# Patient Record
Sex: Female | Born: 1970
Health system: Southern US, Community
[De-identification: ages and names within clinical notes are randomized; demographics above are authoritative.]

## PROBLEM LIST (undated history)

## (undated) DIAGNOSIS — G8929 Other chronic pain: Secondary | ICD-10-CM

## (undated) DIAGNOSIS — M545 Low back pain, unspecified: Secondary | ICD-10-CM

## (undated) DIAGNOSIS — R7303 Prediabetes: Secondary | ICD-10-CM

## (undated) DIAGNOSIS — E669 Obesity, unspecified: Secondary | ICD-10-CM

## (undated) DIAGNOSIS — E0500 Thyrotoxicosis with diffuse goiter without thyrotoxic crisis or storm: Secondary | ICD-10-CM

## (undated) DIAGNOSIS — N920 Excessive and frequent menstruation with regular cycle: Secondary | ICD-10-CM

## (undated) DIAGNOSIS — C439 Malignant melanoma of skin, unspecified: Secondary | ICD-10-CM

## (undated) DIAGNOSIS — H4010X0 Unspecified open-angle glaucoma, stage unspecified: Secondary | ICD-10-CM

## (undated) DIAGNOSIS — F329 Major depressive disorder, single episode, unspecified: Secondary | ICD-10-CM

## (undated) DIAGNOSIS — H5789 Other specified disorders of eye and adnexa: Secondary | ICD-10-CM

## (undated) DIAGNOSIS — F32A Depression, unspecified: Secondary | ICD-10-CM

## (undated) DIAGNOSIS — I10 Essential (primary) hypertension: Secondary | ICD-10-CM

## (undated) DIAGNOSIS — E079 Disorder of thyroid, unspecified: Secondary | ICD-10-CM

## (undated) DIAGNOSIS — Z9889 Other specified postprocedural states: Secondary | ICD-10-CM

## (undated) DIAGNOSIS — M5431 Sciatica, right side: Secondary | ICD-10-CM

## (undated) DIAGNOSIS — T753XXA Motion sickness, initial encounter: Secondary | ICD-10-CM

## (undated) DIAGNOSIS — M1990 Unspecified osteoarthritis, unspecified site: Secondary | ICD-10-CM

## (undated) DIAGNOSIS — E039 Hypothyroidism, unspecified: Secondary | ICD-10-CM

## (undated) DIAGNOSIS — R112 Nausea with vomiting, unspecified: Secondary | ICD-10-CM

## (undated) HISTORY — DX: Depression, unspecified: F32.A

## (undated) HISTORY — DX: Malignant melanoma of skin, unspecified: C43.9

## (undated) HISTORY — DX: Other specified disorders of eye and adnexa: H57.89

## (undated) HISTORY — DX: Major depressive disorder, single episode, unspecified: F32.9

## (undated) HISTORY — PX: EYE SURGERY: SHX253

## (undated) HISTORY — DX: Obesity, unspecified: E66.9

## (undated) HISTORY — DX: Hypothyroidism, unspecified: E03.9

## (undated) HISTORY — DX: Excessive and frequent menstruation with regular cycle: N92.0

## (undated) HISTORY — PX: OTHER SURGICAL HISTORY: SHX169

## (undated) HISTORY — DX: Thyrotoxicosis with diffuse goiter without thyrotoxic crisis or storm: E05.00

## (undated) HISTORY — DX: Disorder of thyroid, unspecified: E07.9

---

## 2010-01-06 HISTORY — PX: ENDOMETRIAL BIOPSY: SHX622

## 2011-02-07 LAB — HM MAMMOGRAPHY: HM MAMMO: NORMAL

## 2011-03-06 LAB — HM PAP SMEAR: HM PAP: NORMAL

## 2011-12-15 ENCOUNTER — Ambulatory Visit: Payer: Self-pay | Admitting: Internal Medicine

## 2012-01-30 ENCOUNTER — Encounter: Payer: Self-pay | Admitting: Internal Medicine

## 2012-01-30 ENCOUNTER — Ambulatory Visit (INDEPENDENT_AMBULATORY_CARE_PROVIDER_SITE_OTHER): Payer: No Typology Code available for payment source | Admitting: Internal Medicine

## 2012-01-30 VITALS — BP 118/78 | HR 79 | Temp 98.1°F | Resp 16 | Ht 69.75 in | Wt 251.5 lb

## 2012-01-30 DIAGNOSIS — J069 Acute upper respiratory infection, unspecified: Secondary | ICD-10-CM

## 2012-01-30 DIAGNOSIS — E039 Hypothyroidism, unspecified: Secondary | ICD-10-CM

## 2012-01-30 DIAGNOSIS — Z309 Encounter for contraceptive management, unspecified: Secondary | ICD-10-CM

## 2012-01-30 DIAGNOSIS — E669 Obesity, unspecified: Secondary | ICD-10-CM

## 2012-01-30 MED ORDER — LEVOFLOXACIN 500 MG PO TABS: 500.0000 mg | ORAL_TABLET | Freq: Every day | ORAL | Status: DC

## 2012-01-30 NOTE — Progress Notes (Signed)
Patient ID: Lauren Higgins, female   DOB: 1970-11-10, 42 y.o.   MRN: [id number]  Patient Active Problem List  Diagnosis  . Obesity (BMI 30.0-34.9)  . Acute URI  . Hypothyroidism (acquired)    Subjective:  CC:   Chief Complaint  Patient presents with  . Establish Care    HPI:   Lauren Higgins is a 42 y.o. female who presents as a new patient to establish primary care with the chief complaint of Sinus congestion and headache since Monday.  Nasal drainage is clear.  coughing up light green sputum started this morning.  Had contact with a patient with flu but has none of the symptoms.  No fever or myalgias.  Using mucinex twice daily.    History of hyperthyroidism in 1994, treated with RA 131 now hypothyroid managed by [facility], now by Dr, Wendall Mola for hypothyroid.      Sees [facility] for PAPs and mammograms. Feb .  Last PAP Feb 2013.    PGF had CA, not sure which type.  MGM had dementia.  No colon or ovarian Ca.   Left knee pain in early January  ,  Saw orthopedics at [facility]. Received  A steroid injection  For presumed bursitis aggravated by a walking trip in [place]  Obesity: prior trial of phentermine by [facility], lost 50 lbs,  Then gained it all back over a few years.  Dr. Tedd Sias has her on a new drug for long term use.(phentermine/.topriimate combo) has lost 22 lbs thus far.   Contraception:  Using "pull out" method .  Not on folic acid.    Past Medical History  Diagnosis Date  . Thyroid disease     History reviewed. No pertinent past surgical history.  Family History  Problem Relation Age of Onset  . Thyroid disease Father   . Mental illness Maternal Grandmother     dementia  . Cancer Paternal Grandfather   . Stroke Paternal Grandfather     History   Social History  . Marital Status: Married    Spouse Name: N/A    Number of Children: N/A  . Years of Education: N/A   Occupational History  . Not on file.   Social History Main Topics  . Smoking  status: Never Smoker   . Smokeless tobacco: Not on file  . Alcohol Use: No  . Drug Use: No  . Sexually Active:    Other Topics Concern  . Not on file   Social History Narrative  . No narrative on file   No Known Allergies  Review of Systems:   Patient denies headache, fevers, malaise, unintentional weight loss, skin rash, eye pain, sinus congestion and sinus pain, sore throat, dysphagia,  hemoptysis , cough, dyspnea, wheezing, chest pain, palpitations, orthopnea, edema, abdominal pain, nausea, melena, diarrhea, constipation, flank pain, dysuria, hematuria, urinary  Frequency, nocturia, numbness, tingling, seizures,  Focal weakness, Loss of consciousness,  Tremor, insomnia, depression, anxiety, and suicidal ideation.      Objective:  BP 118/78  Pulse 79  Temp 98.1 F (36.7 C) (Oral)  Resp 16  Ht 5' 9.75" (1.772 m)  Wt 251 lb 8 oz (114.08 kg)  BMI 36.35 kg/m2  SpO2 98%  LMP 01/25/2012  General appearance: alert, cooperative and appears stated age Ears: normal TM's and external ear canals both ears Throat: lips, mucosa, and tongue normal; teeth and gums normal Neck: no adenopathy, no carotid bruit, supple, symmetrical, trachea midline and thyroid not enlarged, symmetric,  no tenderness/mass/nodules Back: symmetric, no curvature. ROM normal. No CVA tenderness. Lungs: clear to auscultation bilaterally Heart: regular rate and rhythm, S1, S2 normal, no murmur, click, rub or gallop Abdomen: soft, non-tender; bowel sounds normal; no masses,  no organomegaly Pulses: 2+ and symmetric Skin: Skin color, texture, turgor normal. No rashes or lesions Lymph nodes: Cervical, supraclavicular, and axillary nodes normal.  Assessment and Plan:  Obesity (BMI 30.0-34.9) I have addressed  BMI and recommended a low glycemic index diet utilizing smaller more frequent meals to increase metabolism.  I have also recommended that patient start exercising with a goal of 30 minutes of aerobic exercise  a minimum of 5 days per week. Screening for lipid disorders, thyroid and diabetes to be done today.    Acute URI Currently viral given the mild HEENT  symptoms  I have explained that in viral URIS, an antibiotic will not help the symptoms and will increase the risk of developing diarrhea.,  Continue oral and nasal decongestants,  Ibuprofen 400 mg and tylenol 650 mq 8 hrs for aches and pains,  Delsym OTC for cough,  Antibiotics only if fevers, severe facial or ear pain pr grossly purulent nasal discharge or sputum develop.   Hypothyroidism (acquired) Managed by Dr. Wendall Mola  Contraception management Her current method of contraception is at best, optimistic but not full proof. I recommended that she continues to use only pulling out method as a form of contraception that she consider a folic acid supplement  1 mg daily  In the event that she has an unintended pregnancy .   Updated Medication List Outpatient Encounter Prescriptions as of 01/30/2012  Medication Sig Dispense Refill  . desonide (DESOWEN) 0.05 % lotion Apply 1 application topically daily as needed.       Marland Kitchen levofloxacin (LEVAQUIN) 500 MG tablet Take 1 tablet (500 mg total) by mouth daily.  7 tablet  0  . levothyroxine (SYNTHROID, LEVOTHROID) 125 MCG tablet Take 125 mcg by mouth daily.       Marland Kitchen liothyronine (CYTOMEL) 5 MCG tablet Take 5 mcg by mouth daily.       Marland Kitchen spironolactone-hydrochlorothiazide (ALDACTAZIDE) 25-25 MG per tablet Take 1 tablet by mouth daily.

## 2012-01-30 NOTE — Patient Instructions (Addendum)
Please take 1 mg folic acid daily  --------------------------------------------------------------  You have a viral  Syndrome .  The post nasal drip is causing your cough.  Lavage your sinuses twice daly with Simply saline nasal spray.  Use benadryl 25 mg every 8 hours and Sudafed PE 10 to 30 every 8 hours to manage the drainage and congestion.  Gargle with salt water as needed for the sore throat.  Use Delsym for the cough.  If you develop T > 100.4,  Green/bloody  nasal discharge,  Or facial pain,  Start the antibiotic     You first goal is to lose 20%  Of your current body weight over the next 6 months   This is  my version of a  "Low GI"  Diet:  It is not ultra low carb, but will still lower your blood sugars and allow you to lose 5 to 10 lbs per month if you follow it carefully. All of the foods can be found at grocery stores and in bulk at [employer].  The Atkins protein bars and shakes are available in more varieties at [employer], [employer] and [employer].     7 AM Breakfast:  Low carbohydrate Protein  Shakes (I recommend the EAS AdvantEdge "Carb Control" shakes  Or the low carb shakes by Atkins.   Both are available everywhere:  In  cases at [employer]Js  Or in 4 packs at grocery stores and pharmacies  2.5 carbs  (Alternative is  a toasted Arnold's Sandwhich Thin w/ peanut butter, a "Bagel Thin" with cream cheese and salmon) or  a scrambled egg burrito made with a low carb tortilla .  Avoid cereal and bananas, oatmeal too unless you are cooking the old fashioned kind that takes 30-40 minutes to prepare.  the rest is overly processed, has minimal fiber, and is loaded with carbohydrates!   10 AM: Protein bar by Atkins (the snack size, under 200 cal).  There are many varieties , available widely again or in bulk in limited varieties at [employer]Js)  Other so called "protein bars" tend to be loaded with carbohydrates.  Remember, in food advertising, the word "energy" is synonymous for "  carbohydrate."  Lunch: sandwich of [place], (or any lunchmeat, grilled meat or canned tuna), fresh avocado, mayonnaise  and cheese on a lower carbohydrate pita bread, flatbread, or tortilla . Ok to use regular mayonnaise. The bread is the only source or carbohydrate that can be decreased (Joseph's makes a pita bread and a flat bread that are 50 cal and 4 net carbs ; Toufayan makes a low carb flatbread that's 100 cal and 9 net carbs  and  Mission makes a low carb whole wheat tortilla  That is 210 cal and 6 net carbs)  3 PM:  Mid day :  Another protein bar,  Or a  cheese stick (100 cal, 0 carbs),  Or 1 ounce of  almonds, walnuts, pistachios, pecans, peanuts,  Macadamia nuts. Or a Dannon light n Fit greek yogurt, 80 cal 8 net carbs . Avoid "granola"; the dried cranberries and raisins are loaded with carbohydrates. Mixed nuts ok if no raisins or cranberries or dried fruit.      6 PM  Dinner:  "mean and green:"  Meat/chicken/fish or a high protein legume; , with a green salad, and a low GI  Veggie (broccoli, cauliflower, green beans, spinach, brussel sprouts. Lima beans) : Avoid "Low fat dressings, as well as Reyne Dumas and [address]! They are loaded  with sugar! Instead use ranch, vinagrette,  Blue cheese, etc.  There is a low carb pasta by Dreamfield's available at [facility] that is acceptable and tastes great. Try Michel Angel's chicken piccata over low carb pasta. The chicken dish is 0 carbs, and can be found in frozen section at [employer]Js and [employer]. Also try [employer]" (pulled pork, no sauce,  0 carbs) and his pot roast.   both are in the refrigerated section at [employer]Js   Dreamfield's makes a low carb pasta only 5 g/serving.  Available at all grocery stores,  And tastes like normal pasta  9 PM snack : Breyer's "low carb" fudgsicle or  ice cream bar (Carb Smart line), or  Weight Watcher's ice cream bar , or another "no sugar added" ice cream;a serving of fresh berries/cherries with whipped  cream (Avoid bananas, pineapple, grapes  and watermelon on a regular basis because they are high in sugar)   Remember that snack Substitutions should be less than 10 carbs per serving and meals < 20 carbs. Remember to subtract fiber grams and sugar alcohols to get the "net carbs."

## 2012-02-01 DIAGNOSIS — E039 Hypothyroidism, unspecified: Secondary | ICD-10-CM | POA: Insufficient documentation

## 2012-02-01 DIAGNOSIS — E669 Obesity, unspecified: Secondary | ICD-10-CM | POA: Insufficient documentation

## 2012-02-01 DIAGNOSIS — Z309 Encounter for contraceptive management, unspecified: Secondary | ICD-10-CM

## 2012-02-01 DIAGNOSIS — J069 Acute upper respiratory infection, unspecified: Secondary | ICD-10-CM | POA: Insufficient documentation

## 2012-02-01 HISTORY — DX: Encounter for contraceptive management, unspecified: Z30.9

## 2012-02-01 NOTE — Assessment & Plan Note (Signed)
Managed by Dr. Wendall Mola

## 2012-02-01 NOTE — Assessment & Plan Note (Signed)
Her current method of contraception is at best, optimistic but not full proof. I recommended that she continues to use only pulling out method as a form of contraception that she consider a folic acid supplement  1 mg daily  In the event that she has an unintended pregnancy .

## 2012-02-01 NOTE — Assessment & Plan Note (Signed)
Currently viral given the mild HEENT  symptoms  I have explained that in viral URIS, an antibiotic will not help the symptoms and will increase the risk of developing diarrhea.,  Continue oral and nasal decongestants,  Ibuprofen 400 mg and tylenol 650 mq 8 hrs for aches and pains,  Delsym OTC for cough,  Antibiotics only if fevers, severe facial or ear pain pr grossly purulent nasal discharge or sputum develop.  

## 2012-02-01 NOTE — Assessment & Plan Note (Signed)
I have addressed  BMI and recommended a low glycemic index diet utilizing smaller more frequent meals to increase metabolism.  I have also recommended that patient start exercising with a goal of 30 minutes of aerobic exercise a minimum of 5 days per week. Screening for lipid disorders, thyroid and diabetes to be done today.   

## 2013-02-04 ENCOUNTER — Encounter (INDEPENDENT_AMBULATORY_CARE_PROVIDER_SITE_OTHER): Payer: Self-pay

## 2013-02-04 ENCOUNTER — Ambulatory Visit (INDEPENDENT_AMBULATORY_CARE_PROVIDER_SITE_OTHER): Payer: No Typology Code available for payment source | Admitting: Internal Medicine

## 2013-02-04 ENCOUNTER — Encounter: Payer: Self-pay | Admitting: Internal Medicine

## 2013-02-04 VITALS — BP 130/80 | HR 97 | Temp 98.0°F | Resp 20 | Ht 68.0 in | Wt 250.0 lb

## 2013-02-04 DIAGNOSIS — Z0000 Encounter for general adult medical examination without abnormal findings: Secondary | ICD-10-CM

## 2013-02-04 DIAGNOSIS — M76899 Other specified enthesopathies of unspecified lower limb, excluding foot: Secondary | ICD-10-CM

## 2013-02-04 DIAGNOSIS — M7050 Other bursitis of knee, unspecified knee: Secondary | ICD-10-CM

## 2013-02-04 DIAGNOSIS — E66811 Obesity, class 1: Secondary | ICD-10-CM

## 2013-02-04 DIAGNOSIS — J069 Acute upper respiratory infection, unspecified: Secondary | ICD-10-CM

## 2013-02-04 DIAGNOSIS — E669 Obesity, unspecified: Secondary | ICD-10-CM

## 2013-02-04 NOTE — Progress Notes (Signed)
Pre-visit discussion using our clinic review tool. No additional management support is needed unless otherwise documented below in the visit note.  

## 2013-02-04 NOTE — Assessment & Plan Note (Signed)
Annual comprehensive exam was done excluding breast, pelvic and PAP smear. All screenings have been addressed .

## 2013-02-04 NOTE — Patient Instructions (Signed)
Ok to combine ibuprofen 800 mg three times daily max dose with tylenol 500 mg every 6 hours max daily dose 2000 mg  For OA and bursitis   Talk to hubby about getting sleep study .  I'll be happy to order it but will need to see him first.  If your potassium level is still borderline,  We can try a smaller potassium pill or increase your intake of potassium rich foods

## 2013-02-04 NOTE — Progress Notes (Signed)
Patient ID: Lauren Higgins, female   DOB: 09/22/1970, 43 y.o.   MRN: [id number]    Subjective:     Lauren Higgins is a 43 y.o. female and is here for a comprehensive physical exam.  She had a Pap smear in  2013 by [facility] gynecology. Mammogram is also ordered by gynecology. She sees Dr. Lavone Orn for management of hypothyroidism secondary to history of hyperthyroidism treated with I-131   She was last seen January 2014 and treated last week for sinus congestion and headache.  She has had  several orthopedic evaluations for joint pain involving both knees. Currently symptoms are resolved. She's not exercising however due to prior history of knee pain.    History   Social History  . Marital Status: Married    Spouse Name: N/A    Number of Children: N/A  . Years of Education: N/A   Occupational History  . Not on file.   Social History Main Topics  . Smoking status: Never Smoker   . Smokeless tobacco: Not on file  . Alcohol Use: No  . Drug Use: No  . Sexual Activity: Not on file   Other Topics Concern  . Not on file   Social History Narrative  . No narrative on file   Health Maintenance  Topic Date Due  . Tetanus/tdap  10/15/1990  . Influenza Vaccine  08/06/2013  . Pap Smear  03/05/2014    The following portions of the patient's history were reviewed and updated as appropriate: allergies, current medications, past family history, past medical history, past social history, past surgical history and problem list.  Review of Systems A comprehensive review of systems was negative.   Objective:    BP 130/80  Pulse 97  Temp(Src) 98 F (36.7 C) (Oral)  Resp 20  Ht 5' 8"  (1.727 m)  Wt 250 lb (113.399 kg)  BMI 38.02 kg/m2  SpO2 98%  LMP 01/30/2013   General appearance: alert, cooperative and appears stated age Ears: normal TM's and external ear canals both ears Throat: lips, mucosa, and tongue normal; teeth and gums normal Neck: no adenopathy, no carotid bruit,  supple, symmetrical, trachea midline and thyroid not enlarged, symmetric, no tenderness/mass/nodules Back: symmetric, no curvature. ROM normal. No CVA tenderness. Lungs: clear to auscultation bilaterally Heart: regular rate and rhythm, S1, S2 normal, no murmur, click, rub or gallop Abdomen: soft, non-tender; bowel sounds normal; no masses,  no organomegaly Pulses: 2+ and symmetric Skin: Skin color, texture, turgor normal. No rashes or lesions Lymph nodes: Cervical, supraclavicular, and axillary nodes normal.   Assessment and Plan:   Visit for preventive health examination Annual comprehensive exam was done excluding breast, pelvic and PAP smear. All screenings have been addressed .   Obesity (BMI 30.0-34.9) She has been prescribed phentermine for appetite suppression by Dr. Elisabeth Cara .   Long discussion lasting 20 minutes  regarding herneed for regular aerobic exercise and dietary restriction of carbohydrates. She will return for fasting labs.  Acute URI Symptoms have improved. No further treatment needed.  Bursitis of knee Prior episodes were treated with steroid injections. I discussed use of over-the-counter anti-inflammatories and pain relievers to control symptoms and recommended alternative forms of exercise hat do not aggravate knee joints   Updated Medication List Outpatient Encounter Prescriptions as of 02/04/2013  Medication Sig  . levothyroxine (SYNTHROID, LEVOTHROID) 125 MCG tablet Take 125 mcg by mouth daily.   Marland Kitchen liothyronine (CYTOMEL) 5 MCG tablet Take 5 mcg by mouth daily.   Marland Kitchen  phentermine (ADIPEX-P) 37.5 MG tablet Take 37.5 mg by mouth daily before breakfast.   . potassium chloride SA (K-DUR,KLOR-CON) 20 MEQ tablet Take 20 mEq by mouth daily.   Marland Kitchen spironolactone-hydrochlorothiazide (ALDACTAZIDE) 25-25 MG per tablet Take 1 tablet by mouth daily.   Marland Kitchen tobramycin-dexamethasone (TOBRADEX) ophthalmic solution Place 1 drop into the right eye every 4 (four) hours while awake.   .  [DISCONTINUED] desonide (DESOWEN) 0.05 % lotion Apply 1 application topically daily as needed.   . [DISCONTINUED] levofloxacin (LEVAQUIN) 500 MG tablet Take 1 tablet (500 mg total) by mouth daily.

## 2013-02-06 DIAGNOSIS — M7050 Other bursitis of knee, unspecified knee: Secondary | ICD-10-CM | POA: Insufficient documentation

## 2013-02-06 NOTE — Assessment & Plan Note (Signed)
Symptoms have improved. No further treatment needed.

## 2013-02-06 NOTE — Assessment & Plan Note (Signed)
Prior episodes were treated with steroid injections. I discussed use of over-the-counter anti-inflammatories and pain relievers to control symptoms and recommended alternative forms of exercise hat do not aggravate knee joints

## 2013-02-06 NOTE — Assessment & Plan Note (Addendum)
She has been prescribed phentermine for appetite suppression by Dr. Elisabeth Cara .   Long discussion lasting 20 minutes  regarding herneed for regular aerobic exercise and dietary restriction of carbohydrates. She will return for fasting labs.

## 2013-02-09 ENCOUNTER — Encounter: Payer: Self-pay | Admitting: Internal Medicine

## 2013-02-15 ENCOUNTER — Telehealth: Payer: Self-pay | Admitting: Internal Medicine

## 2013-02-15 DIAGNOSIS — E559 Vitamin D deficiency, unspecified: Secondary | ICD-10-CM | POA: Insufficient documentation

## 2013-02-15 MED ORDER — ERGOCALCIFEROL 1.25 MG (50000 UT) PO CAPS: 50000.0000 [IU] | ORAL_CAPSULE | ORAL | Status: DC

## 2013-02-15 NOTE — Telephone Encounter (Signed)
Please reassign this patient to me , her PCP, so I can look her up more easily.  Thanks!

## 2013-02-16 ENCOUNTER — Other Ambulatory Visit: Payer: Self-pay | Admitting: Internal Medicine

## 2013-02-16 NOTE — Telephone Encounter (Signed)
Done. Thanks.

## 2013-02-23 ENCOUNTER — Telehealth: Payer: Self-pay | Admitting: Internal Medicine

## 2013-03-14 ENCOUNTER — Ambulatory Visit: Payer: Self-pay | Admitting: Ophthalmology

## 2013-04-12 ENCOUNTER — Encounter: Payer: Self-pay | Admitting: Internal Medicine

## 2013-11-23 DIAGNOSIS — I10 Essential (primary) hypertension: Secondary | ICD-10-CM | POA: Insufficient documentation

## 2013-11-23 DIAGNOSIS — E6609 Other obesity due to excess calories: Secondary | ICD-10-CM | POA: Insufficient documentation

## 2013-11-23 DIAGNOSIS — R7303 Prediabetes: Secondary | ICD-10-CM | POA: Insufficient documentation

## 2014-01-24 ENCOUNTER — Ambulatory Visit: Payer: Self-pay

## 2014-02-01 DIAGNOSIS — H532 Diplopia: Secondary | ICD-10-CM | POA: Insufficient documentation

## 2014-02-08 DIAGNOSIS — H50011 Monocular esotropia, right eye: Secondary | ICD-10-CM | POA: Insufficient documentation

## 2014-06-01 DIAGNOSIS — Z9889 Other specified postprocedural states: Secondary | ICD-10-CM | POA: Insufficient documentation

## 2014-12-28 ENCOUNTER — Other Ambulatory Visit: Payer: Self-pay | Admitting: Certified Nurse Midwife

## 2014-12-28 DIAGNOSIS — Z1231 Encounter for screening mammogram for malignant neoplasm of breast: Secondary | ICD-10-CM

## 2015-02-23 ENCOUNTER — Encounter: Payer: Self-pay | Admitting: Emergency Medicine

## 2015-02-23 ENCOUNTER — Emergency Department
Admission: EM | Admit: 2015-02-23 | Discharge: 2015-02-23 | Disposition: A | Discharge status: Home / Self Care | Payer: Managed Care, Other (non HMO) | Attending: Emergency Medicine | Admitting: Emergency Medicine

## 2015-02-23 ENCOUNTER — Emergency Department: Payer: Managed Care, Other (non HMO)

## 2015-02-23 DIAGNOSIS — R519 Headache, unspecified: Secondary | ICD-10-CM

## 2015-02-23 DIAGNOSIS — R51 Headache: Secondary | ICD-10-CM | POA: Insufficient documentation

## 2015-02-23 DIAGNOSIS — Z79899 Other long term (current) drug therapy: Secondary | ICD-10-CM | POA: Insufficient documentation

## 2015-02-23 LAB — CBC WITH DIFFERENTIAL/PLATELET
BASOS ABS: 0.1 10*3/uL (ref 0–0.1)
BASOS PCT: 1 %
Eosinophils Absolute: 0.3 10*3/uL (ref 0–0.7)
Eosinophils Relative: 3 %
HEMATOCRIT: 43.7 % (ref 35.0–47.0)
HEMOGLOBIN: 14.7 g/dL (ref 12.0–16.0)
LYMPHS PCT: 37 %
Lymphs Abs: 3.8 10*3/uL — ABNORMAL HIGH (ref 1.0–3.6)
MCH: 28.5 pg (ref 26.0–34.0)
MCHC: 33.7 g/dL (ref 32.0–36.0)
MCV: 84.6 fL (ref 80.0–100.0)
MONO ABS: 0.6 10*3/uL (ref 0.2–0.9)
Monocytes Relative: 6 %
NEUTROS ABS: 5.4 10*3/uL (ref 1.4–6.5)
NEUTROS PCT: 53 %
Platelets: 227 10*3/uL (ref 150–440)
RBC: 5.16 MIL/uL (ref 3.80–5.20)
RDW: 14.3 % (ref 11.5–14.5)
WBC: 10.2 10*3/uL (ref 3.6–11.0)

## 2015-02-23 LAB — BASIC METABOLIC PANEL
ANION GAP: 10 (ref 5–15)
BUN: 15 mg/dL (ref 6–20)
CHLORIDE: 105 mmol/L (ref 101–111)
CO2: 26 mmol/L (ref 22–32)
Calcium: 9.7 mg/dL (ref 8.9–10.3)
Creatinine, Ser: 0.97 mg/dL (ref 0.44–1.00)
GFR calc non Af Amer: >60 mL/min (ref >60)
GLUCOSE: 89 mg/dL (ref 65–99)
POTASSIUM: 3.7 mmol/L (ref 3.5–5.1)
Sodium: 141 mmol/L (ref 135–145)

## 2015-02-23 MED ORDER — SODIUM CHLORIDE 0.9 % IV BOLUS (SEPSIS)
1000.0000 mL | Freq: Once | INTRAVENOUS | Status: AC
  Administered 2015-02-23: 1000 mL via INTRAVENOUS

## 2015-02-23 MED ORDER — MAGNESIUM SULFATE 2 GM/50ML IV SOLN
2.0000 g | Freq: Once | INTRAVENOUS | Status: AC
  Administered 2015-02-23: 2 g via INTRAVENOUS
  Filled 2015-02-23: qty 50

## 2015-02-23 MED ORDER — PROCHLORPERAZINE MALEATE 10 MG PO TABS: 10.0000 mg | ORAL_TABLET | Freq: Three times a day (TID) | ORAL | Status: DC | PRN

## 2015-02-23 MED ORDER — PROCHLORPERAZINE EDISYLATE 5 MG/ML IJ SOLN
10.0000 mg | Freq: Four times a day (QID) | INTRAMUSCULAR | Status: DC | PRN
  Administered 2015-02-23: 10 mg via INTRAVENOUS
  Filled 2015-02-23: qty 2

## 2015-02-23 NOTE — Discharge Instructions (Signed)
Please seek medical attention for any high fevers, chest pain, shortness of breath, change in behavior, persistent vomiting, bloody stool or any other new or concerning symptoms.   General Headache Without Cause A headache is pain or discomfort felt around the head or neck area. There are many causes and types of headaches. In some cases, the cause may not be found.  HOME CARE  Managing Pain  Take over-the-counter and prescription medicines only as told by your doctor.  Lie down in a dark, quiet room when you have a headache.  If directed, apply ice to the head and neck area:  Put ice in a plastic bag.  Place a towel between your skin and the bag.  Leave the ice on for 20 minutes, 2-3 times per day.  Use a heating pad or hot shower to apply heat to the head and neck area as told by your doctor.  Keep lights dim if bright lights bother you or make your headaches worse. Eating and Drinking  Eat meals on a regular schedule.  Lessen how much alcohol you drink.  Lessen how much caffeine you drink, or stop drinking caffeine. General Instructions  Keep all follow-up visits as told by your doctor. This is important.  Keep a journal to find out if certain things bring on headaches. For example, write down:  What you eat and drink.  How much sleep you get.  Any change to your diet or medicines.  Relax by getting a massage or doing other relaxing activities.  Lessen stress.  Sit up straight. Do not tighten (tense) your muscles.  Do not use tobacco products. This includes cigarettes, chewing tobacco, or e-cigarettes. If you need help quitting, ask your doctor.  Exercise regularly as told by your doctor.  Get enough sleep. This often means 7-9 hours of sleep. GET HELP IF:  Your symptoms are not helped by medicine.  You have a headache that feels different than the other headaches.  You feel sick to your stomach (nauseous) or you throw up (vomit).  You have a  fever. GET HELP RIGHT AWAY IF:   Your headache becomes really bad.  You keep throwing up.  You have a stiff neck.  You have trouble seeing.  You have trouble speaking.  You have pain in the eye or ear.  Your muscles are weak or you lose muscle control.  You lose your balance or have trouble walking.  You feel like you will pass out (faint) or you pass out.  You have confusion.   This information is not intended to replace advice given to you by your health care provider. Make sure you discuss any questions you have with your health care provider.   Document Released: 10/02/2007 Document Revised: 09/13/2014 Document Reviewed: 04/17/2014 Elsevier Interactive Patient Education [employer].

## 2015-02-23 NOTE — ED Provider Notes (Signed)
[facility] Emergency Department Provider Note   ____________________________________________  Time seen: ~1805  I have reviewed the triage vital signs and the nursing notes.   HISTORY  Chief Complaint Headache   History limited by: Not Limited   HPI ANIYIAH ZELL is a 45 y.o. female who presents to the emergency department today because of concerns for headache. She states she has been having a headache for roughly 2 weeks. It had been intermittent but has been constant the past 3 days. She describes it as severe. It is located primarily on the right side of her face and behind her right eye. She denies any change in her vision. She denies any trauma to her head. She has had some associated nausea but no vomiting. She has baseline sensitivity to light since right eye surgeries secondary to Graves' disease. Patient saw her primary care doctor yesterday was given some medication which helped temporarily however the headache came back. Patient denies history of same. Denies any fevers.    Past Medical History  Diagnosis Date  . Thyroid disease     Patient Active Problem List   Diagnosis Date Noted  . Unspecified vitamin D deficiency 02/15/2013  . Bursitis of knee 02/06/2013  . Visit for preventive health examination 02/04/2013  . Obesity (BMI 30.0-34.9) 02/01/2012  . Hypothyroidism (acquired) 02/01/2012  . Contraception management 02/01/2012    Past Surgical History  Procedure Laterality Date  . Eye surgery      Current Outpatient Rx  Name  Route  Sig  Dispense  Refill  . ergocalciferol (VITAMIN D2) 50000 UNITS capsule   Oral   Take 1 capsule (50,000 Units total) by mouth once a week.   12 capsule   0   . levothyroxine (SYNTHROID, LEVOTHROID) 125 MCG tablet   Oral   Take 125 mcg by mouth daily.          Marland Kitchen liothyronine (CYTOMEL) 5 MCG tablet   Oral   Take 5 mcg by mouth daily.          . phentermine (ADIPEX-P) 37.5 MG tablet    Oral   Take 37.5 mg by mouth daily before breakfast.          . spironolactone-hydrochlorothiazide (ALDACTAZIDE) 25-25 MG per tablet   Oral   Take 1 tablet by mouth daily.          Marland Kitchen tobramycin-dexamethasone (TOBRADEX) ophthalmic solution   Right Eye   Place 1 drop into the right eye every 4 (four) hours while awake.            Allergies Review of patient's allergies indicates no known allergies.  Family History  Problem Relation Age of Onset  . Thyroid disease Father   . Mental illness Maternal Grandmother     dementia  . Cancer Paternal Grandfather   . Stroke Paternal Grandfather   . Glaucoma Mother     Social History Social History  Substance Use Topics  . Smoking status: Never Smoker   . Smokeless tobacco: None  . Alcohol Use: No    Review of Systems  Constitutional: Negative for fever. Cardiovascular: Negative for chest pain. Respiratory: Negative for shortness of breath. Gastrointestinal: Negative for abdominal pain, vomiting and diarrhea. Neurological: Positive for headache   10-point ROS otherwise negative.  ____________________________________________   PHYSICAL EXAM:  VITAL SIGNS: ED Triage Vitals  Enc Vitals Group     BP 02/23/15 1557 154/96 mmHg     Pulse Rate 02/23/15 1557 64  Resp 02/23/15 1557 16     Temp 02/23/15 1557 98.3 F (36.8 C)     Temp Source 02/23/15 1557 Oral     SpO2 02/23/15 1557 99 %     Weight 02/23/15 1557 264 lb (119.75 kg)     Height 02/23/15 1557 5' 9"  (1.753 m)     Head Cir --      Peak Flow --      Pain Score 02/23/15 1558 10   Constitutional: Alert and oriented. Appears in mild distress Eyes: Conjunctivae are normal. PERRL. Normal extraocular movements. ENT   Head: Normocephalic and atraumatic.   Nose: No congestion/rhinnorhea.   Mouth/Throat: Mucous membranes are moist.   Neck: No stridor. Hematological/Lymphatic/Immunilogical: No cervical lymphadenopathy. Cardiovascular: Normal rate,  regular rhythm.  No murmurs, rubs, or gallops. Respiratory: Normal respiratory effort without tachypnea nor retractions. Breath sounds are clear and equal bilaterally. No wheezes/rales/rhonchi. Gastrointestinal: Soft and nontender. No distention.  Genitourinary: Deferred Musculoskeletal: Normal range of motion in all extremities. No joint effusions.  No lower extremity tenderness nor edema. Neurologic:  Normal speech and language. No gross focal neurologic deficits are appreciated.  Skin:  Skin is warm, dry and intact. No rash noted. Psychiatric: Mood and affect are normal. Speech and behavior are normal. Patient exhibits appropriate insight and judgment.  ____________________________________________    LABS (pertinent positives/negatives)  Labs Reviewed  CBC WITH DIFFERENTIAL/PLATELET - Abnormal; Notable for the following:    Lymphs Abs 3.8 (*)    All other components within normal limits  BASIC METABOLIC PANEL     ____________________________________________   EKG  None  ____________________________________________    RADIOLOGY  CT head IMPRESSION: No acute intracranial pathology.  Thickened right extraocular muscles compatible with thyroid opthalmopathy.   ____________________________________________   PROCEDURES  Procedure(s) performed: None  Critical Care performed: No  ____________________________________________   INITIAL IMPRESSION / ASSESSMENT AND PLAN / ED COURSE  Pertinent labs & imaging results that were available during my care of the patient were reviewed by me and considered in my medical decision making (see chart for details).  Patient presented to the emergency department today because of concerns for headache. Given the patient does not typically get headaches like this a CT scan was obtained. This did not show any obvious etiology of the headache. The patient states she did feel better after IV fluids and medications. This point I doubt  intracranial bleed given negative CT scan and length of symptoms. Additionally doubt thrombotic clot. Will plan on discharging home with prescription for oral Compazine. Strep patient follow-up with primary care.  ____________________________________________   FINAL CLINICAL IMPRESSION(S) / ED DIAGNOSES  Final diagnoses:  Headache, unspecified headache type     Nance Pear, MD 02/23/15 1927

## 2015-02-23 NOTE — ED Notes (Addendum)
Pt reports intermittent headache x2 weeks; reports constant x3 days. Saw PCP yesterday, given toradol IM and took benadryl, imitrex and phenergan with no relief. Tried medications again today with no relief. Pt reports hx of headaches. Pt reports 3 right eye surgeries in the past year due to graves disease, unknown if related but pain focuses behind right eye and radiates back to ear. Pt reports she is able to turn head a certain way and pain stops.

## 2015-03-01 ENCOUNTER — Other Ambulatory Visit: Payer: Self-pay | Admitting: Ophthalmology

## 2015-03-01 DIAGNOSIS — R519 Headache, unspecified: Secondary | ICD-10-CM

## 2015-03-01 DIAGNOSIS — R51 Headache: Secondary | ICD-10-CM

## 2015-03-01 DIAGNOSIS — E0500 Thyrotoxicosis with diffuse goiter without thyrotoxic crisis or storm: Secondary | ICD-10-CM

## 2015-03-05 ENCOUNTER — Ambulatory Visit
Admission: RE | Admit: 2015-03-05 | Discharge: 2015-03-05 | Disposition: A | Discharge status: Home / Self Care | Payer: Managed Care, Other (non HMO) | Source: Ambulatory Visit | Attending: Ophthalmology | Admitting: Ophthalmology

## 2015-03-05 DIAGNOSIS — E0500 Thyrotoxicosis with diffuse goiter without thyrotoxic crisis or storm: Secondary | ICD-10-CM | POA: Insufficient documentation

## 2015-03-05 DIAGNOSIS — H0520 Unspecified exophthalmos: Secondary | ICD-10-CM | POA: Diagnosis not present

## 2015-03-05 DIAGNOSIS — R51 Headache: Secondary | ICD-10-CM | POA: Insufficient documentation

## 2015-03-05 DIAGNOSIS — R519 Headache, unspecified: Secondary | ICD-10-CM

## 2015-03-19 ENCOUNTER — Ambulatory Visit: Payer: No Typology Code available for payment source

## 2015-03-22 ENCOUNTER — Ambulatory Visit
Admission: RE | Admit: 2015-03-22 | Discharge: 2015-03-22 | Disposition: A | Discharge status: Home / Self Care | Payer: Managed Care, Other (non HMO) | Source: Ambulatory Visit | Attending: Certified Nurse Midwife | Admitting: Certified Nurse Midwife

## 2015-03-22 ENCOUNTER — Other Ambulatory Visit: Payer: Self-pay | Admitting: Certified Nurse Midwife

## 2015-03-22 DIAGNOSIS — Z1231 Encounter for screening mammogram for malignant neoplasm of breast: Secondary | ICD-10-CM

## 2015-03-30 ENCOUNTER — Encounter: Payer: Self-pay | Admitting: Emergency Medicine

## 2015-03-30 ENCOUNTER — Ambulatory Visit
Admission: EM | Admit: 2015-03-30 | Discharge: 2015-03-30 | Disposition: A | Discharge status: Home / Self Care | Payer: Managed Care, Other (non HMO) | Attending: Family Medicine | Admitting: Family Medicine

## 2015-03-30 DIAGNOSIS — J0100 Acute maxillary sinusitis, unspecified: Secondary | ICD-10-CM | POA: Diagnosis not present

## 2015-03-30 HISTORY — DX: Essential (primary) hypertension: I10

## 2015-03-30 MED ORDER — AZITHROMYCIN 250 MG PO TABS
ORAL_TABLET | ORAL | Status: DC
Start: 2015-03-30 — End: 2016-03-28

## 2015-03-30 NOTE — ED Provider Notes (Signed)
CSN: [id number]     Arrival date & time 03/30/15  0920 History   First MD Initiated Contact with Patient 03/30/15 [phone]     Chief Complaint  Patient presents with  . Nasal Congestion   (Consider location/radiation/quality/duration/timing/severity/associated sxs/prior Treatment) HPI   45 year old female who reports that she has had nasal congestion this for about 3 or 4 days which initially improved after using a nasal spray but this morning with worsening Congestion and fatigue. He said no thrill sore throat fever or chills. She denies any coughing. 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 She also has a black eye on the right where she had recently undergone an orbital decompression about 2 weeks ago. Denies any significant nasal discharge has had no bleeding. She does report some shortness of breath with exertion but denies any coughing.  Past Medical History  Diagnosis Date  . Thyroid disease   . Hypertension    Past Surgical History  Procedure Laterality Date  . Eye surgery     Family History  Problem Relation Age of Onset  . Thyroid disease Father   . Mental illness Maternal Grandmother     dementia  . Cancer Paternal Grandfather   . Stroke Paternal Grandfather   . Glaucoma Mother   . Breast cancer Paternal Aunt 54   Social History  Substance Use Topics  . Smoking status: Never Smoker   . Smokeless tobacco: None  . Alcohol Use: No   OB History    No data available     Review of Systems  Constitutional: Negative for fever, chills, activity change and fatigue.  HENT: Positive for congestion, postnasal drip, rhinorrhea, sinus pressure and sneezing.   Respiratory: Positive for shortness of breath. Negative for cough.   All other systems reviewed and are negative.   Allergies  Review of patient's allergies indicates no known allergies.  Home Medications   Prior to  Admission medications   Medication Sig Start Date End Date Taking? Authorizing Provider  HYDROCHLOROTHIAZIDE PO Take 25 mg by mouth.   Yes Historical Provider, MD  azithromycin (ZITHROMAX Z-PAK) 250 MG tablet Use as per package instructions 03/30/15   Lorin Picket, PA-C  ergocalciferol (VITAMIN D2) 50000 UNITS capsule Take 1 capsule (50,000 Units total) by mouth once a week. 02/15/13   Crecencio Mc, MD  levothyroxine (SYNTHROID, LEVOTHROID) 125 MCG tablet Take 137 mcg by mouth daily.  01/02/12   Historical Provider, MD  liothyronine (CYTOMEL) 5 MCG tablet Take 5 mcg by mouth daily.  01/02/12   Historical Provider, MD  phentermine (ADIPEX-P) 37.5 MG tablet Take 37.5 mg by mouth daily before breakfast.  01/07/13   Historical Provider, MD  prochlorperazine (COMPAZINE) 10 MG tablet Take 1 tablet (10 mg total) by mouth every 8 (eight) hours as needed (headache). 02/23/15   Nance Pear, MD  spironolactone-hydrochlorothiazide (ALDACTAZIDE) 25-25 MG per tablet Take 1 tablet by mouth daily.  01/02/12   Historical Provider, MD  tobramycin-dexamethasone [facility]) ophthalmic solution Place 1 drop into the right eye every 4 (four) hours while awake.  01/17/13   Historical Provider, MD   Meds Ordered and Administered this Visit  Medications - No data to display  BP 120/71 mmHg  Pulse 71  Temp(Src) 97.6 F (36.4 C) (Tympanic)  Resp 16  Ht 5' 10"  (1.778 m)  Wt 260 lb (117.935 kg)  BMI 37.31 kg/m2  SpO2  99%  LMP 03/12/2015 No data found.   Physical Exam  Constitutional: She is oriented to person, place, and time. She appears well-developed and well-nourished. No distress.  HENT:  Head: Normocephalic and atraumatic.  Right Ear: External ear normal.  Left Ear: External ear normal.  Nose: Nose normal.  Mouth/Throat: Oropharynx is clear and moist.  Right ear has a bandage from mole excision which she had earlier this morning  Eyes: Pupils are equal, round, and reactive to light. Right eye  exhibits no discharge. Left eye exhibits no discharge.  Neck: Normal range of motion. Neck supple.  Pulmonary/Chest: Effort normal and breath sounds normal. No respiratory distress. She has no wheezes. She has no rales.  Musculoskeletal: Normal range of motion. She exhibits no edema or tenderness.  Lymphadenopathy:    She has no cervical adenopathy.  Neurological: She is alert and oriented to person, place, and time.  Skin: Skin is warm and dry. She is not diaphoretic.  Psychiatric: She has a normal mood and affect. Her behavior is normal. Judgment and thought content normal.  Nursing note and vitals reviewed.   ED Course  Procedures (including critical care time)  Labs Review Labs Reviewed - No data to display  Imaging Review No results found.   Visual Acuity Review  Right Eye Distance:   Left Eye Distance:   Bilateral Distance:    Right Eye Near:   Left Eye Near:    Bilateral Near:         MDM   1. Acute maxillary sinusitis, recurrence not specified    Discharge Medication List as of 03/30/2015 10:29 AM    START taking these medications   Details  azithromycin (ZITHROMAX Z-PAK) 250 MG tablet Use as per package instructions, Normal      Plan: 1. Test/x-ray results and diagnosis reviewed with patient 2. rx as per orders; risks, benefits, potential side effects reviewed with patient 3. Recommend supportive treatment with Rest and fluids as necessary. I have told her I don't know if the surgery that she had on her eye has any bearing on her symptoms today but I am going to err on the side of conservative and give her a prescription for Z-Pak. If she does not improve or worsens she should contact the surgeon immediately. I've also given her today off from work and to allow her to rest today and tomorrow since she is leaving on a business trip on Sunday driving to [place]. Hopefully this will clear quickly for her. She'll continue using her Flonase and have  recommended her considering Zyrtec-D or Allegra-D for her congestion. If she has any problems or worsening she should contact her primary care physician or the surgeon. 4. F/u prn if symptoms worsen or don't improve     Lorin Picket, PA-C 03/30/15 1041

## 2015-03-30 NOTE — Discharge Instructions (Signed)

## 2015-03-30 NOTE — ED Notes (Signed)
Pt reports nasal congestion that started about 3-4 days ago improved after using nasal spray but then awoke this morning with worsening congestion and fatigue. Pt denies sore throat, fever, or cough

## 2015-10-08 NOTE — Progress Notes (Signed)
[name] [address] [place], [place] [address] Phone: [phone] Subjective:    I'm seeing this patient by the request  of:  Dion Body, MD   CC: Left shoulder pain  [id number]  Lauren Higgins is a 45 y.o. female coming in with complaint of left shoulder pain. Has had a history of rotator cuff tendinitis as well as a subacromial bursitis of the left shoulder. Has been in rehabilitation for this previously. Patient was seen 6 weeks ago by another provider and was given an injection of left shoulder. Patient states Continuing him pain. Seems to be more on the left shoulder. States that certain activities such as even doing her bra has seemed to be more difficult. States that sleeping on it as a severe pain. Rates the severity of 7 out of 10. Mild radiation down the arm but denies any neck pain. No weakness. No numbness. Has not responded to oral anti-inflammatories or any heat or ice.     Past Medical History:  Diagnosis Date  . Hypertension   . Thyroid disease    Past Surgical History:  Procedure Laterality Date  . EYE SURGERY     Social History   Social History  . Marital status: Married    Spouse name: N/A  . Number of children: N/A  . Years of education: N/A   Social History Main Topics  . Smoking status: Never Smoker  . Smokeless tobacco: None  . Alcohol use No  . Drug use: No  . Sexual activity: Not Asked   Other Topics Concern  . None   Social History Narrative  . None   No Known Allergies Family History  Problem Relation Age of Onset  . Thyroid disease Father   . Mental illness Maternal Grandmother     dementia  . Cancer Paternal Grandfather   . Stroke Paternal Grandfather   . Glaucoma Mother   . Breast cancer Paternal Aunt 47    Past medical history, social, surgical and family history all reviewed in electronic medical record.  No pertanent information unless stated regarding to the chief complaint.    Review of Systems: No headache, visual changes, nausea, vomiting, diarrhea, constipation, dizziness, abdominal pain, skin rash, fevers, chills, night sweats, weight loss, swollen lymph nodes, body aches, joint swelling, muscle aches, chest pain, shortness of breath, mood changes.   Objective  Blood pressure 118/80, pulse 65, weight 258 lb (117 kg), SpO2 97 %.  General: No apparent distress alert and oriented x3 mood and affect normal, dressed appropriately.  HEENT: Pupils equal, extraocular movements intact  Respiratory: Patient's speak in full sentences and does not appear short of breath  Cardiovascular: No lower extremity edema, non tender, no erythema  Skin: Warm dry intact with no signs of infection or rash on extremities or on axial skeleton.  Abdomen: Soft nontender  Neuro: Cranial nerves II through XII are intact, neurovascularly intact in all extremities with 2+ DTRs and 2+ pulses.  Lymph: No lymphadenopathy of posterior or anterior cervical chain or axillae bilaterally.  Gait normal with good balance and coordination.  MSK:  Non tender with full range of motion and good stability and symmetric strength and tone of  elbows, wrist, hip, knee and ankles bilaterally.  Shoulder: left Inspection reveals no abnormalities, atrophy or asymmetry. Palpation is normal with no tenderness over AC joint or bicipital groove.  Limited range of motion lacking the last 20 of forward flexion, external rotation of 5, and  internal rotation to sacrum Rotator cuff strength normal throughout. signs of impingement with positive Neer and Hawkin's tests, but negative empty can sign. Speeds and Yergason's tests normal. No labral pathology noted with negative Obrien's, negative clunk and good stability. Normal scapular function observed. No painful arc and no drop arm sign. No apprehension sign Contralateral shoulder unremarkable  MSK US performed of: left This study was ordered, performed, and  interpreted by Charlann Boxer D.O.  Shoulder:   Supraspinatus:  Appears normal on long and transverse views, significant thickening of the anterior capsule noted Infraspinatus:  Appears normal on long and transverse views. Significant increase in Doppler flow Subscapularis:  Appears normal on long and transverse views. Thickening of the anterior capsule noted Teres Minor:  Appears normal on long and transverse views. AC joint:  Moderate capsule distention Glenohumeral Joint:  Appears normal without effusion. Glenoid Labrum:  Intact without visualized tears. Patient does have severe thickening on the posterior capsule Biceps Tendon:  Appears normal on long and transverse views, no fraying of tendon, tendon located in intertubercular groove, no subluxation with shoulder internal or external rotation.  Impression: Frozen shoulder  Procedure: Real-time Ultrasound Guided Injection of left glenohumeral joint Device: GE Logiq E  Ultrasound guided injection is preferred based studies that show increased duration, increased effect, greater accuracy, decreased procedural pain, increased response rate with ultrasound guided versus blind injection.  Verbal informed consent obtained.  Time-out conducted.  Noted no overlying erythema, induration, or other signs of local infection.  Skin prepped in a sterile fashion.  Local anesthesia: Topical Ethyl chloride.  With sterile technique and under real time ultrasound guidance:  Joint visualized.  23g 1  inch needle inserted posterior approach. Pictures taken for needle placement. Patient did have injection of 2 cc of 1% lidocaine, 2 cc of 0.5% Marcaine, and 1.0 cc of Kenalog 40 mg/dL. Completed without difficulty  Pain immediately resolved suggesting accurate placement of the medication.  Advised to call if fevers/chills, erythema, induration, drainage, or persistent bleeding.  Images permanently stored and available for review in the ultrasound unit.    Impression: Technically successful ultrasound guided injection.  Procedure note 23300; 15 minutes spent for Therapeutic exercises as stated in above notes.  This included exercises focusing on stretching, strengthening, with significant focus on eccentric aspects.  Shoulder Exercises that included:  Basic scapular stabilization to include adduction and depression of scapula Scaption, focusing on proper movement and good control Internal and External rotation utilizing a theraband, with elbow tucked at side entire time Rows with theraband   Proper technique shown and discussed handout in great detail with ATC.  All questions were discussed and answered.     Impression and Recommendations:     This case required medical decision making of moderate complexity.      Note: This dictation was prepared with Dragon dictation along with smaller phrase technology. Any transcriptional errors that result from this process are unintentional.

## 2015-10-09 ENCOUNTER — Other Ambulatory Visit: Payer: Self-pay

## 2015-10-09 ENCOUNTER — Ambulatory Visit (INDEPENDENT_AMBULATORY_CARE_PROVIDER_SITE_OTHER): Payer: Managed Care, Other (non HMO) | Admitting: Family Medicine

## 2015-10-09 ENCOUNTER — Encounter: Payer: Self-pay | Admitting: Family Medicine

## 2015-10-09 VITALS — BP 118/80 | HR 65 | Wt 258.0 lb

## 2015-10-09 DIAGNOSIS — G8929 Other chronic pain: Secondary | ICD-10-CM | POA: Diagnosis not present

## 2015-10-09 DIAGNOSIS — M7502 Adhesive capsulitis of left shoulder: Secondary | ICD-10-CM | POA: Diagnosis not present

## 2015-10-09 DIAGNOSIS — M25512 Pain in left shoulder: Secondary | ICD-10-CM

## 2015-10-09 DIAGNOSIS — M7500 Adhesive capsulitis of unspecified shoulder: Secondary | ICD-10-CM | POA: Insufficient documentation

## 2015-10-09 MED ORDER — DICLOFENAC SODIUM 2 % TD SOLN: 2.0000 "application " | Freq: Two times a day (BID) | TRANSDERMAL | 3 refills | Status: DC

## 2015-10-09 NOTE — Patient Instructions (Signed)
Good to see you.  Ice 20 minutes 2 times daily. Usually after activity and before bed. Exercises 3 times a week.  pennsaid pinkie amount topically 2 times daily as needed.  Vitamin D 2000 IU dialy  Keep moving the arm.  Ok to do the meloxicam  See me again in 4-6 weeks to make sure you are well.

## 2015-10-09 NOTE — Assessment & Plan Note (Signed)
Patient was given injection today. Work with Product/process development scientist July the exercises. Given topical anti-inflammatories that ankle be beneficial. Discussed icing regimen. Patient will continue with range of motion. Follow-up and see me again in 4-6 weeks. Forcing pain consider formal physical therapy.

## 2015-10-25 ENCOUNTER — Other Ambulatory Visit: Payer: Managed Care, Other (non HMO)

## 2015-10-25 DIAGNOSIS — G8929 Other chronic pain: Secondary | ICD-10-CM

## 2015-10-25 DIAGNOSIS — M25512 Pain in left shoulder: Principal | ICD-10-CM

## 2015-11-10 NOTE — Progress Notes (Signed)
[facility] [place], [place] [address] Phone: [phone] Subjective:    I'm seeing this patient by the request  of:  Dion Body, MD   CC: Left shoulder pain Follow-up  [id number]  Lauren Higgins is a 45 y.o. female coming in with complaint of left shoulder pain. Has had a history of rotator cuff tendinitis as well as a subacromial bursitis of the left shoulde patient was initially by anotherf provider and given an injection 14 weeks ago. Was seen by me 6 weeks ago and diagnosed with frozen shoulder and and was given an injection. Patient has done all conservative therapy including formal physical therapy. Patient was to continue conservative therapy such as home exercises icing protocol and topical anti-inflammatories. Patient states Pain is improved overall. Starting have increasing pain again at night. Denies any radiation of the shoulder any new weakness. States that it just continues to give her some difficulty. Now is having audible popping sometimes it does seem to relieve some of the pain though.    Past Medical History:  Diagnosis Date  . Hypertension   . Thyroid disease    Past Surgical History:  Procedure Laterality Date  . EYE SURGERY     Social History   Social History  . Marital status: Married    Spouse name: N/A  . Number of children: N/A  . Years of education: N/A   Social History Main Topics  . Smoking status: Never Smoker  . Smokeless tobacco: None  . Alcohol use No  . Drug use: No  . Sexual activity: Not Asked   Other Topics Concern  . None   Social History Narrative  . None   No Known Allergies Family History  Problem Relation Age of Onset  . Thyroid disease Father   . Mental illness Maternal Grandmother     dementia  . Cancer Paternal Grandfather   . Stroke Paternal Grandfather   . Glaucoma Mother   . Breast cancer Paternal Aunt 67    Past medical history, social, surgical and family  history all reviewed in electronic medical record.  No pertanent information unless stated regarding to the chief complaint.   Review of Systems: No headache, visual changes, nausea, vomiting, diarrhea, constipation, dizziness, abdominal pain, skin rash, fevers, chills, night sweats, weight loss, swollen lymph nodes, body aches, joint swelling, muscle aches, chest pain, shortness of breath, mood changes.   Objective  Blood pressure 114/80, pulse 69, height 5' 8"  (1.727 m), weight 263 lb (119.3 kg), SpO2 96 %.  Systems examined below as of 11/12/15 General: NAD A&O x3 mood, affect normal  HEENT: Pupils equal, extraocular movements intact no nystagmus Respiratory: not short of breath at rest or with speaking Cardiovascular: No lower extremity edema, non tender Skin: Warm dry intact with no signs of infection or rash on extremities or on axial skeleton. Abdomen: Soft nontender, no masses Neuro: Cranial nerves  intact, neurovascularly intact in all extremities with 2+ DTRs and 2+ pulses. Lymph: No lymphadenopathy appreciated today  Gait normal with good balance and coordination.  MSK: Non tender with full range of motion and good stability and symmetric strength and tone of  elbows, wrist,  knee hips and ankles bilaterally.  Shoulder: left Inspection reveals no abnormalities, atrophy or asymmetry. Palpation is normal with no tenderness over AC joint or bicipital groove.  Limited range of motion lacking the last 15 of forward flexion, external rotation of 5, and internal rotation to L5  Rotator cuff strength normal throughout. Mary mild impingement sign still remaining Speeds and Yergason's tests normal. No labral pathology noted with negative Obrien's, negative clunk and good stability. Normal scapular function observed. No painful arc and no drop arm sign. No apprehension sign Contralateral shoulder unremarkable Mild improvement from previous exam       Impression and  Recommendations:     This case required medical decision making of moderate complexity.      Note: This dictation was prepared with Dragon dictation along with smaller phrase technology. Any transcriptional errors that result from this process are unintentional.

## 2015-11-12 ENCOUNTER — Ambulatory Visit (INDEPENDENT_AMBULATORY_CARE_PROVIDER_SITE_OTHER): Payer: Managed Care, Other (non HMO) | Admitting: Family Medicine

## 2015-11-12 ENCOUNTER — Encounter: Payer: Self-pay | Admitting: Family Medicine

## 2015-11-12 DIAGNOSIS — E669 Obesity, unspecified: Secondary | ICD-10-CM | POA: Diagnosis not present

## 2015-11-12 DIAGNOSIS — M7502 Adhesive capsulitis of left shoulder: Secondary | ICD-10-CM

## 2015-11-12 DIAGNOSIS — E559 Vitamin D deficiency, unspecified: Secondary | ICD-10-CM

## 2015-11-12 MED ORDER — GABAPENTIN 100 MG PO CAPS: 200.0000 mg | ORAL_CAPSULE | Freq: Every day | ORAL | 3 refills | Status: DC

## 2015-11-12 NOTE — Assessment & Plan Note (Addendum)
Discussed with patient again. We'll discuss with her at great length. We discussed home exercises, icing protocol, which activities to do a which was to avoid. Patient is going to start on gabapentin for the nighttime pain. We discussed which activities to potentially avoid. Patient will continue all the other medications. Patient come back in 3 weeks. If worsening symptoms we'll consider repeat injection. Discussed with patient also has any worsening symptoms advance imaging would be warranted to rule out any labral pathology that can be within the differential.

## 2015-11-12 NOTE — Assessment & Plan Note (Signed)
Incurred supplementation.

## 2015-11-12 NOTE — Assessment & Plan Note (Signed)
Encourage weight loss. Patient will continue to monitor.

## 2015-11-12 NOTE — Patient Instructions (Signed)
Good to see you  Lauren Higgins is your friend  Keep working on the range of motion as much as you can.  Keep hands within peripheral vision with lifting.  Gabapentin 21m at night See me again in 3-4 weeks and if needed we will inject again.

## 2015-12-09 NOTE — Progress Notes (Signed)
[name] [address] [place], [place] [address] Phone: [phone] Subjective:    I'm seeing this patient by the request  of:  Dion Body, MD   CC: Left shoulder pain Follow-up  [id number]  Lauren Higgins is a 45 y.o. female coming in with complaint of left shoulder pain. Has had a history of rotator cuff tendinitis as well as a subacromial bursitis of the left shoulder.  Patient did see me 2 months ago and was given an injection of the shoulder. One month ago patient was doing much better. Patient was started on gabapentin low for any type of cervical pathology that can be contributing and to help with nighttime pain. Patient states was doing well but has not been doing the exercises quite as regularly. Has been fairly noncompliant with the medications. States that it has not notice any significant improvement in range of motion and is having worsening pain again. Starting wake her up at night.    Past Medical History:  Diagnosis Date  . Hypertension   . Thyroid disease    Past Surgical History:  Procedure Laterality Date  . EYE SURGERY     Social History   Social History  . Marital status: Married    Spouse name: N/A  . Number of children: N/A  . Years of education: N/A   Social History Main Topics  . Smoking status: Never Smoker  . Smokeless tobacco: None  . Alcohol use No  . Drug use: No  . Sexual activity: Not Asked   Other Topics Concern  . None   Social History Narrative  . None   No Known Allergies Family History  Problem Relation Age of Onset  . Thyroid disease Father   . Mental illness Maternal Grandmother     dementia  . Cancer Paternal Grandfather   . Stroke Paternal Grandfather   . Glaucoma Mother   . Breast cancer Paternal Aunt 60    Past medical history, social, surgical and family history all reviewed in electronic medical record.  No pertanent information unless stated regarding to the chief  complaint.   Review of Systems: No headache, visual changes, nausea, vomiting, diarrhea, constipation, dizziness, abdominal pain, skin rash, fevers, chills, night sweats, weight loss, swollen lymph nodes, body aches, joint swelling, muscle aches, chest pain, shortness of breath, mood changes.   Objective  Blood pressure 112/84, pulse 66, height 5' 9"  (1.753 m), weight 265 lb 3.2 oz (120.3 kg).  Systems examined below as of 12/10/15 General: NAD A&O x3 mood, affect normal  HEENT: Pupils equal, extraocular movements intact no nystagmus Respiratory: not short of breath at rest or with speaking Cardiovascular: No lower extremity edema, non tender Skin: Warm dry intact with no signs of infection or rash on extremities or on axial skeleton. Abdomen: Soft nontender, no masses Neuro: Cranial nerves  intact, neurovascularly intact in all extremities with 2+ DTRs and 2+ pulses. Lymph: No lymphadenopathy appreciated today  Gait normal with good balance and coordination.  MSK: Non tender with full range of motion and good stability and symmetric strength and tone of  elbows, wrist,  knee hips and ankles bilaterally.  Shoulder: left Inspection reveals no abnormalities, atrophy or asymmetry. Palpation is normal with no tenderness over AC joint or bicipital groove.  Limited range of motion lacking the last 15 of forward flexion, external rotation of 2, and internal rotation to L5 Rotator cuff strength normal throughout. Increasing impingement signs Speeds and Yergason's tests  normal. No labral pathology noted with negative Obrien's, negative clunk and good stability. Normal scapular function observed. No painful arc and no drop arm sign. No apprehension sign Contralateral shoulder unremarkable Worsening symptoms  Procedure: Real-time Ultrasound Guided Injection of left glenohumeral joint Device: GE Logiq E  Ultrasound guided injection is preferred based studies that show increased duration,  increased effect, greater accuracy, decreased procedural pain, increased response rate with ultrasound guided versus blind injection.  Verbal informed consent obtained.  Time-out conducted.  Noted no overlying erythema, induration, or other signs of local infection.  Skin prepped in a sterile fashion.  Local anesthesia: Topical Ethyl chloride.  With sterile technique and under real time ultrasound guidance:  Joint visualized.  23g 1  inch needle inserted posterior approach. Pictures taken for needle placement. Patient did have injection of 2 cc of 1% lidocaine, 2 cc of 0.5% Marcaine, and 1cc of Kenalog 40 mg/dL. Completed without difficulty  Pain immediately resolved suggesting accurate placement of the medication.  Advised to call if fevers/chills, erythema, induration, drainage, or persistent bleeding.  Images permanently stored and available for review in the ultrasound unit.  Impression: Technically successful ultrasound guided injection.    Impression and Recommendations:     This case required medical decision making of moderate complexity.      Note: This dictation was prepared with Dragon dictation along with smaller phrase technology. Any transcriptional errors that result from this process are unintentional.

## 2015-12-10 ENCOUNTER — Ambulatory Visit: Payer: Self-pay

## 2015-12-10 ENCOUNTER — Encounter: Payer: Self-pay | Admitting: Family Medicine

## 2015-12-10 ENCOUNTER — Ambulatory Visit (INDEPENDENT_AMBULATORY_CARE_PROVIDER_SITE_OTHER): Payer: Managed Care, Other (non HMO) | Admitting: Family Medicine

## 2015-12-10 VITALS — BP 112/84 | HR 66 | Ht 69.0 in | Wt 265.2 lb

## 2015-12-10 DIAGNOSIS — M25512 Pain in left shoulder: Secondary | ICD-10-CM

## 2015-12-10 DIAGNOSIS — M7502 Adhesive capsulitis of left shoulder: Secondary | ICD-10-CM

## 2015-12-10 NOTE — Patient Instructions (Signed)
Good to see you  Nicole Kindred PT will call you and try one time.  I hoe the injection takes care of it soon Keep moving it and continue everything else you are doing.  See me again in 6 weeks if not perfect  Happy holidays!

## 2015-12-10 NOTE — Assessment & Plan Note (Signed)
Patient given injection. Tolerated the procedure well. Sent to formal physical therapy today with worsening symptoms. Patient hasn't been noncompliant with the medications otherwise. We'll see how patient does over the course the next 4-6 weeks and then follow-up for further evaluation.

## 2015-12-17 ENCOUNTER — Other Ambulatory Visit: Payer: Self-pay | Admitting: Certified Nurse Midwife

## 2015-12-17 DIAGNOSIS — Z1231 Encounter for screening mammogram for malignant neoplasm of breast: Secondary | ICD-10-CM

## 2015-12-25 DIAGNOSIS — Z7185 Encounter for immunization safety counseling: Secondary | ICD-10-CM | POA: Insufficient documentation

## 2016-03-14 ENCOUNTER — Other Ambulatory Visit: Payer: Self-pay | Admitting: Ophthalmology

## 2016-03-14 DIAGNOSIS — H0520 Unspecified exophthalmos: Secondary | ICD-10-CM

## 2016-03-14 DIAGNOSIS — R0981 Nasal congestion: Secondary | ICD-10-CM

## 2016-03-20 ENCOUNTER — Ambulatory Visit
Admission: RE | Admit: 2016-03-20 | Discharge: 2016-03-20 | Disposition: A | Discharge status: Home / Self Care | Payer: Managed Care, Other (non HMO) | Source: Ambulatory Visit | Attending: Ophthalmology | Admitting: Ophthalmology

## 2016-03-20 ENCOUNTER — Ambulatory Visit: Payer: Self-pay | Admitting: Certified Nurse Midwife

## 2016-03-24 ENCOUNTER — Ambulatory Visit
Admission: RE | Admit: 2016-03-24 | Discharge: 2016-03-24 | Disposition: A | Discharge status: Home / Self Care | Payer: Managed Care, Other (non HMO) | Source: Ambulatory Visit | Attending: Ophthalmology | Admitting: Ophthalmology

## 2016-03-25 ENCOUNTER — Ambulatory Visit
Admission: RE | Admit: 2016-03-25 | Discharge: 2016-03-25 | Disposition: A | Discharge status: Home / Self Care | Payer: Managed Care, Other (non HMO) | Source: Ambulatory Visit | Attending: Ophthalmology | Admitting: Ophthalmology

## 2016-03-26 ENCOUNTER — Ambulatory Visit
Admission: RE | Admit: 2016-03-26 | Discharge: 2016-03-26 | Disposition: A | Discharge status: Home / Self Care | Payer: Managed Care, Other (non HMO) | Source: Ambulatory Visit | Attending: Certified Nurse Midwife | Admitting: Certified Nurse Midwife

## 2016-03-26 DIAGNOSIS — Z1231 Encounter for screening mammogram for malignant neoplasm of breast: Secondary | ICD-10-CM

## 2016-03-28 ENCOUNTER — Encounter: Payer: Self-pay | Admitting: Certified Nurse Midwife

## 2016-03-28 ENCOUNTER — Ambulatory Visit (INDEPENDENT_AMBULATORY_CARE_PROVIDER_SITE_OTHER): Payer: Managed Care, Other (non HMO) | Admitting: Certified Nurse Midwife

## 2016-03-28 VITALS — BP 124/72 | HR 107 | Ht 70.0 in | Wt 265.0 lb

## 2016-03-28 DIAGNOSIS — Z01419 Encounter for gynecological examination (general) (routine) without abnormal findings: Secondary | ICD-10-CM | POA: Diagnosis not present

## 2016-03-28 DIAGNOSIS — N920 Excessive and frequent menstruation with regular cycle: Secondary | ICD-10-CM

## 2016-03-28 DIAGNOSIS — Z124 Encounter for screening for malignant neoplasm of cervix: Secondary | ICD-10-CM | POA: Diagnosis not present

## 2016-04-01 LAB — IGP, APTIMA HPV
HPV Aptima: NEGATIVE
PAP Smear Comment: 0

## 2016-04-03 ENCOUNTER — Encounter: Payer: Self-pay | Admitting: Certified Nurse Midwife

## 2016-04-03 DIAGNOSIS — N920 Excessive and frequent menstruation with regular cycle: Secondary | ICD-10-CM | POA: Insufficient documentation

## 2016-04-03 DIAGNOSIS — E0500 Thyrotoxicosis with diffuse goiter without thyrotoxic crisis or storm: Secondary | ICD-10-CM | POA: Insufficient documentation

## 2016-04-03 DIAGNOSIS — E079 Disorder of thyroid, unspecified: Secondary | ICD-10-CM | POA: Insufficient documentation

## 2016-04-03 NOTE — Progress Notes (Addendum)
Gynecology Annual Exam  PCP: Dion Body, MD  Chief Complaint:  Chief Complaint  Patient presents with  . Gynecologic Exam    History of Present Illness: Patient is a 46 y.o. G1P1 presents for annual exam. The patient has no new gyn complaints today. Has had problems with heavy menses since 2012 and had a workup including an endometrial biopsy at that time. A repeat ultrasound last year was normal. Not interested in any medical or surgical treatment at this time.  Her menses currently are every 3-4 weeks and last 5 days with 2-3 heavier days requiring pad change every 1-2 hours.  LMP: Patient's last menstrual period was 03/16/2016 (exact date). Has cramping at the end of her menses. The patient is sexually active. She currently uses vasectomy  for contraception.   The patient's past medical history is notable for a history of hypothyroidism following radioactive ablation in November 1994. She has exophthalmos and has had multiple eye surgeries for decompression since December 2015 at [facility]. This surgery caused some diplopia and she had other surgeries to try to correct this problem, but she continues to have problems with diplopia. She had a  left eye decompression surgery/ and ethmoidectomy (01/30/2016) since her last annual visit (03/17/15). She has had sinus problems and has been on antibiotics and prednisone recently. She has a CT scan scheduled today at [facility] on her head. Her past medical history is also significant for melanoma which was removed from behind her left thigh. She is followed by her dermatologist every 3-6 months. Her past medical history is also remarkable for obesity, hypertension, and depression.  Her most recent Pap smear was obtained 03/17/15 and was a NIL. Her most recent mammogram was on 03/26/2016 and was negative. There is a positive history of breast cancer in her paternal aunt and maternal grandmother. Genetic testing has not been done. There is no family history  of ovarian cancer. The patient does not use monthly self breast exams. She does not smoke or drink alcohol. The patient exercises regularly. She does get adequate calcium in her diet. She had a recent cholesterol screening in 2017 that was normal.  Review of Systems: Review of Systems  Constitutional: Negative for chills, fever and weight loss.  HENT: Positive for congestion and sinus pain. Negative for sore throat.   Eyes: Positive for blurred vision, double vision and pain.  Respiratory: Negative for hemoptysis, shortness of breath and wheezing.   Cardiovascular: Negative for chest pain, palpitations and leg swelling.  Gastrointestinal: Negative for abdominal pain, blood in stool, diarrhea, heartburn, nausea and vomiting.  Genitourinary: Negative for dysuria, frequency, hematuria and urgency.       Positive for menorrhagia and dysmenorrhea.  Musculoskeletal: Negative for back pain, joint pain and myalgias.  Skin: Negative for itching and rash.  Neurological: Negative for dizziness, tingling and headaches.  Endo/Heme/Allergies: Negative for environmental allergies and polydipsia. Does not bruise/bleed easily.       Negative for hirsutism   Psychiatric/Behavioral: Negative for depression. The patient is not nervous/anxious and does not have insomnia.     Past Medical History:  Past Medical History:  Diagnosis Date  . Depression   . Graves disease   . Hypertension   . Hypothyroidism    following iodine ablation  . Melanoma ([place])   . Menorrhagia   . Obesity   . Thyroid eye disease     Past Surgical History:  Past Surgical History:  Procedure Laterality Date  . EYE SURGERY  multiple eye decompression surgeries from 2015 till present  . radioactive iodine ablation      Obstetric History: G1P1  Family History:  Family History  Problem Relation Age of Onset  . Thyroid disease Father   . Hypertension Father   . Dementia Maternal Grandmother     dementia  . Breast cancer  Maternal Grandmother 95  . Stroke Paternal Grandfather   . Glaucoma Mother   . Hypertension Mother   . Breast cancer Paternal Aunt 23    Social History:  Social History   Social History  . Marital status: Married    Spouse name: N/A  . Number of children: 1  . Years of education: N/A   Occupational History  . Education officer, museum    Social History Main Topics  . Smoking status: Never Smoker  . Smokeless tobacco: Never Used  . Alcohol use No  . Drug use: No  . Sexual activity: Yes    Partners: Male    Birth control/ protection: Condom, Surgical     Comment: Vasectomy   Other Topics Concern  . Not on file   Social History Narrative  . No narrative on file    Allergies:  No Known Allergies  Medications: Prior to Admission medications   Medication Sig Start Date End Date Taking? Authorizing Provider  Cholecalciferol (D 2000) 2000 units TABS Take by mouth.   Yes Historical Provider, MD  levothyroxine (SYNTHROID, LEVOTHROID) 137 MCG tablet Take 150 mcg by mouth daily before breakfast.    Yes Historical Provider, MD  spironolactone-hydrochlorothiazide (ALDACTAZIDE) 25-25 MG per tablet Take 1 tablet by mouth daily.  01/02/12  Yes Historical Provider, MD  Vitamin D, Ergocalciferol, (DRISDOL) 50000 units CAPS capsule Take 50,000 Units by mouth every 7 (seven) days.    Historical Provider, MD    Physical Exam Vitals: Blood pressure 124/72, pulse (!) 107, height 5' 10"  (1.778 m), weight 120.2 kg (265 lb), last menstrual period 03/16/2016.  General: pleasant, WF, in NAD HEENT: normocephalic, anicteric. exophthalmos bilaterally with left>right . Thyroid: no enlargement, no palpable nodules Pulmonary: No increased work of breathing, CTAB Cardiovascular: RRR without murmur Breast: Breast symmetrical, no tenderness, no palpable nodules or masses, no skin or nipple retraction present, no nipple discharge.  No axillary, infraclavicular, or supraclavicular  lymphadenopathy. Abdomen:obese, soft, non-tender, non-distended.  Umbilicus without lesions.  No hepatomegaly,  or masses palpable. No evidence of hernia  Genitourinary:  External: Normal external female genitalia.  Normal urethral meatus, normal Bartholin's and Skene's glands.    Vagina: Normal vaginal mucosa, no evidence of prolapse.    Cervix: Grossly normal in appearance, no bleeding  Uterus: Non-enlarged, mobile, normal contour. anteverted to midplane  Adnexa: ovaries non-enlarged, no adnexal masses  Rectal: deferred  Lymphatic: no evidence of inguinal lymphadenopathy Extremities: no edema, erythema, or tenderness Neurologic: Grossly intact Psychiatric: mood appropriate, affect full    Assessment: 46 y.o. G1P1 well woman exam Menorrhagia-does not desire further treatment at this time.  Plan:   1) Mammogram  - recommend continued yearly screening mammogram  2) Pap smear done  3) Osteoporosis prevention-continue on vitamin D supplementation. Getting adequate calcium in diet. Continue exercise program.  4) Routine healthcare maintenance including cholesterol, diabetes screening per PCP    5) Follow up 1 year for routine annual  Dalia Heading, [place]

## 2016-04-07 ENCOUNTER — Encounter: Payer: Self-pay | Admitting: Certified Nurse Midwife

## 2016-06-20 DIAGNOSIS — E781 Pure hyperglyceridemia: Secondary | ICD-10-CM | POA: Insufficient documentation

## 2016-08-18 DIAGNOSIS — Q759 Congenital malformation of skull and face bones, unspecified: Secondary | ICD-10-CM | POA: Insufficient documentation

## 2016-08-18 DIAGNOSIS — H5333 Simultaneous visual perception without fusion: Secondary | ICD-10-CM | POA: Insufficient documentation

## 2016-10-01 NOTE — Telephone Encounter (Signed)
Error

## 2016-12-31 DIAGNOSIS — E7800 Pure hypercholesterolemia, unspecified: Secondary | ICD-10-CM | POA: Insufficient documentation

## 2017-02-03 ENCOUNTER — Other Ambulatory Visit: Payer: Self-pay | Admitting: Certified Nurse Midwife

## 2017-02-03 DIAGNOSIS — Z1231 Encounter for screening mammogram for malignant neoplasm of breast: Secondary | ICD-10-CM

## 2017-03-30 ENCOUNTER — Ambulatory Visit
Admission: RE | Admit: 2017-03-30 | Discharge: 2017-03-30 | Disposition: A | Discharge status: Home / Self Care | Payer: Managed Care, Other (non HMO) | Source: Ambulatory Visit | Attending: Certified Nurse Midwife | Admitting: Certified Nurse Midwife

## 2017-03-30 DIAGNOSIS — Z1231 Encounter for screening mammogram for malignant neoplasm of breast: Secondary | ICD-10-CM | POA: Insufficient documentation

## 2017-04-01 ENCOUNTER — Other Ambulatory Visit: Payer: Self-pay | Admitting: Certified Nurse Midwife

## 2017-04-01 DIAGNOSIS — R928 Other abnormal and inconclusive findings on diagnostic imaging of breast: Secondary | ICD-10-CM

## 2017-04-01 DIAGNOSIS — N6489 Other specified disorders of breast: Secondary | ICD-10-CM

## 2017-04-02 ENCOUNTER — Ambulatory Visit (INDEPENDENT_AMBULATORY_CARE_PROVIDER_SITE_OTHER): Payer: Managed Care, Other (non HMO) | Admitting: Certified Nurse Midwife

## 2017-04-02 ENCOUNTER — Encounter: Payer: Self-pay | Admitting: Certified Nurse Midwife

## 2017-04-02 VITALS — BP 130/82 | HR 94 | Ht 70.0 in | Wt 242.0 lb

## 2017-04-02 DIAGNOSIS — Z01419 Encounter for gynecological examination (general) (routine) without abnormal findings: Secondary | ICD-10-CM

## 2017-04-02 DIAGNOSIS — N921 Excessive and frequent menstruation with irregular cycle: Secondary | ICD-10-CM | POA: Diagnosis not present

## 2017-04-02 DIAGNOSIS — Z124 Encounter for screening for malignant neoplasm of cervix: Secondary | ICD-10-CM

## 2017-04-02 LAB — HM PAP SMEAR: HM Pap smear: NORMAL

## 2017-04-02 NOTE — Progress Notes (Signed)
Gynecology Annual Exam  PCP: [place], [place] Chief Complaint:  Chief Complaint  Patient presents with  . Gynecologic Exam    History of Present Illness: Patient is a 46 y.o. MWF, G1P1, who presents for her annual gyn exam. The patient has no new gyn complaints today. Has had problems with heavy menses since 2012 and had a workup including an endometrial biopsy at that time. A repeat ultrasound 2017 was normal. Is interested in an endometrial ablation in office.  Her menses currently are every 3-4 weeks and last 4-5 days with 2-3 heavier days requiring pad change every 1-2 hours.  LMP: Patient's last menstrual period was 03/23/2017 (exact date). Denies dysmenorrhea. The patient is sexually active. She currently uses vasectomy  for contraception.    The patient's past medical history is notable for a history of hypothyroidism following radioactive ablation in November 1994 for Graves disease. She has exophthalmos and has had multiple eye surgeries for decompression since December 2015 at [facility]. This surgery caused some diplopia and she had other surgeries to try to correct this problem, but she continues to have problems with diplopia. She had 3 more eye surgeries  since her last annual visit (03/28/2016).   Her past medical history is also significant for melanoma which was removed from behind her left thigh. She is followed by her dermatologist every 3-6 months. Her past medical history is also remarkable for obesity, hypertension, and depression. She has started seeing PA Celine Mans at the [facility]. She has lost 23# since her last visit using Adipex and Metformin.   Her most recent Pap smear was obtained 03/28/2016 and was a NIL/negative Her most recent mammogram was on 03/30/2017 and was Birads 0 due to some asymmetry in the left breast. Additional views and ultrasound are needed. Her prior mammogram 03/25/2016 was negative. There is a positive  history of breast cancer in her paternal aunt and maternal grandmother. Genetic testing has not been done. There is no family history of ovarian cancer. The patient does not do monthly self breast exams. She does not smoke or drink alcohol. The patient exercises regularly. She does get adequate calcium in her diet and vitamin supplement. She had a recent cholesterol screening in 2018 that was borderline (238 TC, 174 tri, 52.3 HDL, 151 LDL).  Review of Systems: Review of Systems  Constitutional: Positive for weight loss (intentional ). Negative for chills and fever.  HENT: Negative for sore throat.   Eyes: Positive for blurred vision, double vision and pain (eye irritation).  Respiratory: Negative for hemoptysis, shortness of breath and wheezing.   Cardiovascular: Negative for chest pain, palpitations and leg swelling.  Gastrointestinal: Negative for abdominal pain, blood in stool, diarrhea, heartburn, nausea and vomiting.  Genitourinary: Negative for dysuria, frequency, hematuria and urgency.       Positive for menorrhagia.  Musculoskeletal: Positive for joint pain (and swelling). Negative for back pain and myalgias.       Positive for muscle weakness  Skin: Negative for itching and rash.  Neurological: Negative for dizziness, tingling and headaches.  Endo/Heme/Allergies: Negative for environmental allergies and polydipsia. Does not bruise/bleed easily.       Negative for hirsutism   Psychiatric/Behavioral: Negative for depression. The patient has insomnia. The patient is not nervous/anxious.     Past Medical History:  Past Medical History:  Diagnosis Date  . Depression   . Graves disease   . Hypertension   . Hypothyroidism  following iodine ablation  . Melanoma ([place])   . Menorrhagia   . Obesity   . Thyroid eye disease     Past Surgical History:  Past Surgical History:  Procedure Laterality Date  . ENDOMETRIAL BIOPSY  2012   proliferative with focal breakdown  . EYE SURGERY      multiple eye decompression surgeries from 2015 till present  . radioactive iodine ablation     Graves disease    Obstetric History: G1P1  Family History:  Family History  Problem Relation Age of Onset  . Thyroid disease Father   . Hypertension Father   . Dementia Maternal Grandmother        dementia  . Breast cancer Maternal Grandmother 95  . Stroke Paternal Grandfather   . Glaucoma Mother   . Hypertension Mother   . Breast cancer Paternal Aunt 98    Social History:  Social History   Socioeconomic History  . Marital status: Married    Spouse name: Not on file  . Number of children: 1  . Years of education: Not on file  . Highest education level: Not on file  Occupational History  . Occupation: Education officer, museum  Social Needs  . Financial resource strain: Not on file  . Food insecurity:    Worry: Not on file    Inability: Not on file  . Transportation needs:    Medical: Not on file    Non-medical: Not on file  Tobacco Use  . Smoking status: Never Smoker  . Smokeless tobacco: Never Used  Substance and Sexual Activity  . Alcohol use: No  . Drug use: No  . Sexual activity: Yes    Partners: Male    Birth control/protection: Condom, Surgical    Comment: Vasectomy  Lifestyle  . Physical activity:    Days per week: 4 days    Minutes per session: 60 min  . Stress: Not at all  Relationships  . Social connections:    Talks on phone: Not on file    Gets together: Not on file    Attends religious service: Not on file    Active member of club or organization: Not on file    Attends meetings of clubs or organizations: Not on file    Relationship status: Not on file  . Intimate partner violence:    Fear of current or ex partner: Not on file    Emotionally abused: Not on file    Physically abused: Not on file    Forced sexual activity: Not on file  Other Topics Concern  . Not on file  Social History Narrative  . Not on file    Allergies:  No Known  Allergies  Medications:  Current Outpatient Medications on File Prior to Visit  Medication Sig Dispense Refill  . Cyanocobalamin (VITAMIN B-12) 5000 MCG SUBL Place under the tongue.    . hydrochlorothiazide (HYDRODIURIL) 25 MG tablet     . levOCARNitine (L-CARNITINE) 250 MG CAPS Take by mouth.    Marland Kitchen MAGNESIUM OXIDE 400 PO Take by mouth.    . metFORMIN (GLUCOPHAGE) 500 MG tablet Take by mouth.    . Multiple Vitamin (MULTIVITAMIN) capsule Take 1 capsule by mouth daily. Doterra vitamins    . NP THYROID 30 MG tablet     . phentermine (ADIPEX-P) 37.5 MG tablet     . Probiotic Product (PROBIOTIC-10 PO) Take by mouth.     No current facility-administered medications on file prior to visit.   Physical Exam  Vitals: BP 130/82   Pulse 94   Ht 5' 10"  (1.778 m)   Wt 242 lb (109.8 kg)   LMP 03/23/2017 (Exact Date)   BMI 34.72 kg/m  General: pleasant, WF, in NAD HEENT: normocephalic, anicteric.  Thyroid: no enlargement, no palpable nodules Pulmonary: No increased work of breathing, CTAB Cardiovascular: RRR without murmur Breast: Breast symmetrical, no tenderness, no palpable nodules or masses, no skin or nipple retraction present, no nipple discharge.  No axillary, infraclavicular, or supraclavicular lymphadenopathy. Abdomen:obese, soft, non-tender, non-distended.  Umbilicus without lesions.  No hepatomegaly,  or masses palpable. No evidence of hernia  Genitourinary:  External: Normal external female genitalia.  Normal urethral meatus, normal Bartholin's and Skene's glands.    Vagina: Normal vaginal mucosa, no evidence of prolapse.    Cervix: Grossly normal in appearance, no bleeding  Uterus: Non-enlarged, mobile, normal contour. anteverted to midplane  Adnexa: ovaries non-enlarged, no adnexal masses  Rectal: deferred  Lymphatic: no evidence of inguinal lymphadenopathy Extremities: no edema, erythema, or tenderness Neurologic: Grossly intact Psychiatric: mood appropriate, affect  full    Assessment: 47 y.o. G1P1 well woman exam Menorrhagia-desires ablation Birads 0 mammogram  Plan:   1) Mammogram   Can call [place] to schedule additional views and ultrasound  2) Pap smear done  3) Osteoporosis prevention-continue on vitamin D and calcium supplementation.  Continue exercise program.  4) Routine healthcare maintenance including cholesterol, diabetes screening per PCP    5) Schedule conference appointment with Dr Kenton Kingfisher to discuss in office endometrial ablation for menorrhagia  Dalia Heading, CNM

## 2017-04-02 NOTE — Patient Instructions (Signed)
Endometrial Ablation Endometrial ablation is a procedure that destroys the thin inner layer of the lining of the uterus (endometrium). This procedure may be done:  To stop heavy periods.  To stop bleeding that is causing anemia.  To control irregular bleeding.  To treat bleeding caused by small tumors (fibroids) in the endometrium.  This procedure is often an alternative to major surgery, such as removal of the uterus and cervix (hysterectomy). As a result of this procedure:  You may not be able to have children. However, if you are premenopausal (you have not gone through menopause): ? You may still have a small chance of getting pregnant. ? You will need to use a reliable method of birth control after the procedure to prevent pregnancy.  You may stop having a menstrual period, or you may have only a small amount of bleeding during your period. Menstruation may return several years after the procedure.  Tell a health care provider about:  Any allergies you have.  All medicines you are taking, including vitamins, herbs, eye drops, creams, and over-the-counter medicines.  Any problems you or family members have had with the use of anesthetic medicines.  Any blood disorders you have.  Any surgeries you have had.  Any medical conditions you have. What are the risks? Generally, this is a safe procedure. However, problems may occur, including:  A hole (perforation) in the uterus or bowel.  Infection of the uterus, bladder, or vagina.  Bleeding.  Damage to other structures or organs.  An air bubble in the lung (air embolus).  Problems with pregnancy after the procedure.  Failure of the procedure.  Decreased ability to diagnose cancer in the endometrium.  What happens before the procedure?  You will have tests of your endometrium to make sure there are no pre-cancerous cells or cancer cells present.  You may have an ultrasound of the uterus.  You may be given  medicines to thin the endometrium.  Ask your health care provider about: ? Changing or stopping your regular medicines. This is especially important if you take diabetes medicines or blood thinners. ? Taking medicines such as aspirin and ibuprofen. These medicines can thin your blood. Do not take these medicines before your procedure if your doctor tells you not to.  Plan to have someone take you home from the hospital or clinic. What happens during the procedure?  You will lie on an exam table with your feet and legs supported as in a pelvic exam.  To lower your risk of infection: ? Your health care team will wash or sanitize their hands and put on germ-free (sterile) gloves. ? Your genital area will be washed with soap.  An IV tube will be inserted into one of your veins.  You will be given a medicine to help you relax (sedative).  A surgical instrument with a light and camera (resectoscope) will be inserted into your vagina and moved into your uterus. This allows your surgeon to see inside your uterus.  Endometrial tissue will be removed using one of the following methods: ? Radiofrequency. This method uses a radiofrequency-alternating electric current to remove the endometrium. ? Cryotherapy. This method uses extreme cold to freeze the endometrium. ? Heated-free liquid. This method uses a heated saltwater (saline) solution to remove the endometrium. ? Microwave. This method uses high-energy microwaves to heat up the endometrium and remove it. ? Thermal balloon. This method involves inserting a catheter with a balloon tip into the uterus. The balloon tip is  filled with heated fluid to remove the endometrium. The procedure may vary among health care providers and hospitals. What happens after the procedure?  Your blood pressure, heart rate, breathing rate, and blood oxygen level will be monitored until the medicines you were given have worn off.  As tissue healing occurs, you may  notice vaginal bleeding for 4-6 weeks after the procedure. You may also experience: ? Cramps. ? Thin, watery vaginal discharge that is light pink or brown in color. ? A need to urinate more frequently than usual. ? Nausea.  Do not drive for 24 hours if you were given a sedative.  Do not have sex or insert anything into your vagina until your health care provider approves. Summary  Endometrial ablation is done to treat the many causes of heavy menstrual bleeding.  The procedure may be done only after medications have been tried to control the bleeding.  Plan to have someone take you home from the hospital or clinic. This information is not intended to replace advice given to you by your health care provider. Make sure you discuss any questions you have with your health care provider. Document Released: 11/02/2003 Document Revised: 01/10/2016 Document Reviewed: 01/10/2016 Elsevier Interactive Patient Education  2017 [employer].

## 2017-04-08 ENCOUNTER — Encounter (INDEPENDENT_AMBULATORY_CARE_PROVIDER_SITE_OTHER): Payer: Self-pay

## 2017-04-08 ENCOUNTER — Ambulatory Visit
Admission: RE | Admit: 2017-04-08 | Discharge: 2017-04-08 | Disposition: A | Discharge status: Home / Self Care | Payer: Managed Care, Other (non HMO) | Source: Ambulatory Visit | Attending: Certified Nurse Midwife | Admitting: Certified Nurse Midwife

## 2017-04-08 DIAGNOSIS — N6489 Other specified disorders of breast: Secondary | ICD-10-CM | POA: Diagnosis present

## 2017-04-08 DIAGNOSIS — R928 Other abnormal and inconclusive findings on diagnostic imaging of breast: Secondary | ICD-10-CM | POA: Diagnosis present

## 2017-04-08 LAB — IGP,RFX APTIMA HPV ALL PTH: PAP SMEAR COMMENT: 0

## 2017-04-10 ENCOUNTER — Ambulatory Visit (INDEPENDENT_AMBULATORY_CARE_PROVIDER_SITE_OTHER): Payer: Managed Care, Other (non HMO) | Admitting: Obstetrics & Gynecology

## 2017-04-10 ENCOUNTER — Encounter: Payer: Self-pay | Admitting: Obstetrics & Gynecology

## 2017-04-10 DIAGNOSIS — N921 Excessive and frequent menstruation with irregular cycle: Secondary | ICD-10-CM | POA: Diagnosis not present

## 2017-04-10 NOTE — Patient Instructions (Signed)
Endometrial Ablation Endometrial ablation is a procedure that destroys the thin inner layer of the lining of the uterus (endometrium). This procedure may be done:  To stop heavy periods.  To stop bleeding that is causing anemia.  To control irregular bleeding.  To treat bleeding caused by small tumors (fibroids) in the endometrium.  This procedure is often an alternative to major surgery, such as removal of the uterus and cervix (hysterectomy). As a result of this procedure:  You may not be able to have children. However, if you are premenopausal (you have not gone through menopause): ? You may still have a small chance of getting pregnant. ? You will need to use a reliable method of birth control after the procedure to prevent pregnancy.  You may stop having a menstrual period, or you may have only a small amount of bleeding during your period. Menstruation may return several years after the procedure.  Tell a health care provider about:  Any allergies you have.  All medicines you are taking, including vitamins, herbs, eye drops, creams, and over-the-counter medicines.  Any problems you or family members have had with the use of anesthetic medicines.  Any blood disorders you have.  Any surgeries you have had.  Any medical conditions you have. What are the risks? Generally, this is a safe procedure. However, problems may occur, including:  A hole (perforation) in the uterus or bowel.  Infection of the uterus, bladder, or vagina.  Bleeding.  Damage to other structures or organs.  An air bubble in the lung (air embolus).  Problems with pregnancy after the procedure.  Failure of the procedure.  Decreased ability to diagnose cancer in the endometrium.  What happens before the procedure?  You will have tests of your endometrium to make sure there are no pre-cancerous cells or cancer cells present.  You may have an ultrasound of the uterus.  You may be given  medicines to thin the endometrium.  Ask your health care provider about: ? Changing or stopping your regular medicines. This is especially important if you take diabetes medicines or blood thinners. ? Taking medicines such as aspirin and ibuprofen. These medicines can thin your blood. Do not take these medicines before your procedure if your doctor tells you not to.  Plan to have someone take you home from the hospital or clinic. What happens during the procedure?  You will lie on an exam table with your feet and legs supported as in a pelvic exam.  To lower your risk of infection: ? Your health care team will wash or sanitize their hands and put on germ-free (sterile) gloves. ? Your genital area will be washed with soap.  An IV tube will be inserted into one of your veins.  You will be given a medicine to help you relax (sedative).  A surgical instrument with a light and camera (resectoscope) will be inserted into your vagina and moved into your uterus. This allows your surgeon to see inside your uterus.  Endometrial tissue will be removed using one of the following methods: ? Radiofrequency. This method uses a radiofrequency-alternating electric current to remove the endometrium. ? Cryotherapy. This method uses extreme cold to freeze the endometrium. ? Heated-free liquid. This method uses a heated saltwater (saline) solution to remove the endometrium. ? Microwave. This method uses high-energy microwaves to heat up the endometrium and remove it. ? Thermal balloon. This method involves inserting a catheter with a balloon tip into the uterus. The balloon tip is  filled with heated fluid to remove the endometrium. The procedure may vary among health care providers and hospitals. What happens after the procedure?  Your blood pressure, heart rate, breathing rate, and blood oxygen level will be monitored until the medicines you were given have worn off.  As tissue healing occurs, you may  notice vaginal bleeding for 4-6 weeks after the procedure. You may also experience: ? Cramps. ? Thin, watery vaginal discharge that is light pink or brown in color. ? A need to urinate more frequently than usual. ? Nausea.  Do not drive for 24 hours if you were given a sedative.  Do not have sex or insert anything into your vagina until your health care provider approves. Summary  Endometrial ablation is done to treat the many causes of heavy menstrual bleeding.  The procedure may be done only after medications have been tried to control the bleeding.  Plan to have someone take you home from the hospital or clinic. This information is not intended to replace advice given to you by your health care provider. Make sure you discuss any questions you have with your health care provider. Document Released: 11/02/2003 Document Revised: 01/10/2016 Document Reviewed: 01/10/2016 Elsevier Interactive Patient Education  2017 [employer].   Endometrial Biopsy, Care After This sheet gives you information about how to care for yourself after your procedure. Your health care provider may also give you more specific instructions. If you have problems or questions, contact your health care provider. What can I expect after the procedure? After the procedure, it is common to have:  Mild cramping.  A small amount of vaginal bleeding for a few days. This is normal.  Follow these instructions at home:  Take over-the-counter and prescription medicines only as told by your health care provider.  Do not douche, use tampons, or have sexual intercourse until your health care provider approves.  Return to your normal activities as told by your health care provider. Ask your health care provider what activities are safe for you.  Follow instructions from your health care provider about any activity restrictions, such as restrictions on strenuous exercise or heavy lifting. Contact a health care provider  if:  You have heavy bleeding, or bleed for longer than 2 days after the procedure.  You have bad smelling discharge from your vagina.  You have a fever or chills.  You have a burning sensation when urinating or you have difficulty urinating.  You have severe pain in your lower abdomen. Get help right away if:  You have severe cramps in your stomach or back.  You pass large blood clots.  Your bleeding increases.  You become weak or light-headed, or you pass out. Summary  After the procedure, it is common to have mild cramping and a small amount of vaginal bleeding for a few days.  Do not douche, use tampons, or have sexual intercourse until your health care provider approves.  Return to your normal activities as told by your health care provider. Ask your health care provider what activities are safe for you. This information is not intended to replace advice given to you by your health care provider. Make sure you discuss any questions you have with your health care provider. Document Released: 10/13/2012 Document Revised: 01/09/2016 Document Reviewed: 01/09/2016 Elsevier Interactive Patient Education  2017 [employer].

## 2017-04-10 NOTE — Progress Notes (Signed)
  HPI: Dysfunctional Uterine Bleeding Patient complains of irregular menses. She had been bleeding irregularly. She is now bleeding every 20-28 days and menses are lasting 5 days. She changes her pad or tampon every 1 hours. Clots are large in size and she has issues w soiling of clothes. Dysmenorrhea:moderate, occurring throughout menses. Cyclic symptoms include: none. Current contraception: vasectomy. History of infertility: no. History of abnormal Pap smear: no.  Thyroid abnormality may be contributing to AUB as well.   PMHx: She  has a past medical history of Depression, Graves disease, Hypertension, Hypothyroidism, Melanoma ([place]), Menorrhagia, Obesity, and Thyroid eye disease. Also,  has a past surgical history that includes Eye surgery; radioactive iodine ablation; and Endometrial biopsy (2012)., family history includes Breast cancer (age of onset: 50) in her paternal aunt; Breast cancer (age of onset: 78) in her maternal grandmother; Dementia in her maternal grandmother; Glaucoma in her mother; Hypertension in her father and mother; Stroke in her paternal grandfather; Thyroid disease in her father.,  reports that she has never smoked. She has never used smokeless tobacco. She reports that she does not drink alcohol or use drugs.  She has a current medication list which includes the following prescription(s): vitamin b-12, hydrochlorothiazide, l-carnitine, magnesium oxide, metformin, multivitamin, np thyroid, phentermine, and probiotic product. Also, has No Known Allergies.  ROS  Objective: BP 130/82   Pulse 89   Ht 5' 9"  (1.753 m)   Wt 242 lb (109.8 kg)   LMP 03/23/2017 (Exact Date)   BMI 35.74 kg/m   Physical examination Constitutional NAD, Conversant  Skin No rashes, lesions or ulceration.   Extremities: Moves all appropriately.  Normal ROM for age. No lymphadenopathy.  Neuro: Grossly intact  Psych: Oriented to PPT.  Normal mood. Normal affect.    Endometrial Biopsy After  discussion with the patient regarding her abnormal uterine bleeding I recommended that she proceed with an endometrial biopsy for further diagnosis. The risks, benefits, alternatives, and indications for an endometrial biopsy were discussed with the patient in detail. She understood the risks including infection, bleeding, cervical laceration and uterine perforation.  Verbal consent was obtained.   PROCEDURE NOTE:  [place] endometrial biopsy was performed using aseptic technique with iodine preparation.  The uterus was sounded to a length of 7 cm.  Adequate sampling was obtained with minimal blood loss.  The patient tolerated the procedure well.  Disposition will be pending pathology.  Prior US reviewed, normal.  Assessment:  Menorrhagia with irregular cycle Patient was told that it is normal to have menstrual bleeding after an endometrial ablation, only about 40% of patients become amenorrheic, 50% of patients have normal or light periods, and 10% of patients have no change in their bleeding pattern and may need further intervention.  She was told she will observe her periods for a few months after her ablation to see what her periods will be like; it is recommended to wait until at least three months after the procedure before making conclusions about how periods are going to be like after an ablation.  EMB today  Barnett Applebaum, MD, [facility], [facility] 04/10/2017  5:17 PM

## 2017-04-13 ENCOUNTER — Telehealth: Payer: Self-pay | Admitting: Obstetrics & Gynecology

## 2017-04-13 NOTE — Telephone Encounter (Signed)
[place] notified.

## 2017-04-13 NOTE — Telephone Encounter (Signed)
Patient is scheduled for IN OFFICE ABLATION ([facility]) on Thursday, 05/07/17 @ 1:30pm w/ Dr. Kenton Kingfisher, and H&P at [facility] on Monday, 05/04/17 @ 4:30pm. Cost and insurance info discussed. Phone# and ext given.

## 2017-04-13 NOTE — Telephone Encounter (Signed)
-----   Message from Gae Dry, MD sent at 04/10/2017  5:13 PM EDT ----- Regarding: In Office Ablation May 2, w preop Apr 29 plz  Surgery Booking Request Patient Full Name:  Lauren Higgins  MRN: [id number]  DOB: [birth year]  Surgeon: Hoyt Koch, MD  Requested Surgery Date and Time: May 2 or so Primary Diagnosis AND Code: Menometrorrhaia Secondary Diagnosis and Code:  Surgical Procedure: IN OFFICE ABLATION L&D Notification: No Admission Status: OFFICE Length of Surgery: 30 Special Case Needs: [place] H&P: yes (date)

## 2017-04-14 NOTE — Telephone Encounter (Signed)
Thx

## 2017-04-15 LAB — PATHOLOGY

## 2017-05-04 ENCOUNTER — Encounter: Payer: Managed Care, Other (non HMO) | Admitting: Obstetrics & Gynecology

## 2017-05-04 ENCOUNTER — Encounter: Payer: Self-pay | Admitting: Obstetrics & Gynecology

## 2017-05-04 ENCOUNTER — Ambulatory Visit (INDEPENDENT_AMBULATORY_CARE_PROVIDER_SITE_OTHER): Payer: Managed Care, Other (non HMO) | Admitting: Obstetrics & Gynecology

## 2017-05-04 VITALS — BP 120/80 | Ht 69.0 in | Wt 241.0 lb

## 2017-05-04 DIAGNOSIS — N921 Excessive and frequent menstruation with irregular cycle: Secondary | ICD-10-CM | POA: Diagnosis not present

## 2017-05-04 MED ORDER — MEPERIDINE HCL 50 MG PO TABS: 100.0000 mg | ORAL_TABLET | Freq: Once | ORAL | 0 refills | Status: AC

## 2017-05-04 MED ORDER — DIAZEPAM 5 MG PO TABS: 5.0000 mg | ORAL_TABLET | Freq: Once | ORAL | 0 refills | Status: AC

## 2017-05-04 MED ORDER — MISOPROSTOL 200 MCG PO TABS: 200.0000 ug | ORAL_TABLET | Freq: Once | ORAL | 0 refills | Status: DC

## 2017-05-04 MED ORDER — PROMETHAZINE HCL 25 MG PO TABS: 25.0000 mg | ORAL_TABLET | Freq: Once | ORAL | 0 refills | Status: DC

## 2017-05-04 MED ORDER — HYDROCODONE-ACETAMINOPHEN 5-325 MG PO TABS: 1.0000 | ORAL_TABLET | Freq: Four times a day (QID) | ORAL | 0 refills | Status: DC | PRN

## 2017-05-04 NOTE — Progress Notes (Signed)
PRE-OPERATIVE HISTORY AND PHYSICAL EXAM  HPI:  Lauren Higgins is a 47 y.o. G1P1001 Patient's last menstrual period was 04/17/2017.; she is being admitted for surgery related to abnormal uterine bleeding.  Menorrhagia, for ablation.  PMHx: Past Medical History:  Diagnosis Date  . Depression   . Graves disease   . Hypertension   . Hypothyroidism    following iodine ablation  . Melanoma ([place])   . Menorrhagia   . Obesity   . Thyroid eye disease    Past Surgical History:  Procedure Laterality Date  . ENDOMETRIAL BIOPSY  2012   proliferative with focal breakdown  . EYE SURGERY     multiple eye decompression surgeries from 2015 till present  . radioactive iodine ablation     Graves disease   Family History  Problem Relation Age of Onset  . Thyroid disease Father   . Hypertension Father   . Dementia Maternal Grandmother        dementia  . Breast cancer Maternal Grandmother 95  . Stroke Paternal Grandfather   . Glaucoma Mother   . Hypertension Mother   . Breast cancer Paternal Aunt 42   Social History   Tobacco Use  . Smoking status: Never Smoker  . Smokeless tobacco: Never Used  Substance Use Topics  . Alcohol use: No  . Drug use: No    Current Outpatient Medications:  .  Cyanocobalamin (VITAMIN B-12) 5000 MCG SUBL, Place under the tongue., Disp: , Rfl:  .  diazepam (VALIUM) 5 MG tablet, Take 1 tablet (5 mg total) by mouth once for 1 dose. One hour prior to procedure, Disp: 2 tablet, Rfl: 0 .  hydrochlorothiazide (HYDRODIURIL) 25 MG tablet, , Disp: , Rfl:  .  HYDROcodone-acetaminophen (NORCO) 5-325 MG tablet, Take 1 tablet by mouth every 6 (six) hours as needed for moderate pain., Disp: 10 tablet, Rfl: 0 .  levOCARNitine (L-CARNITINE) 250 MG CAPS, Take by mouth., Disp: , Rfl:  .  MAGNESIUM OXIDE 400 PO, Take by mouth., Disp: , Rfl:  .  meperidine (DEMEROL) 50 MG tablet, Take 2 tablets (100 mg total) by mouth once for 1 dose. One hour prior to procedure., Disp: 2  tablet, Rfl: 0 .  metFORMIN (GLUCOPHAGE) 500 MG tablet, Take by mouth., Disp: , Rfl:  .  misoprostol (CYTOTEC) 200 MCG tablet, Take 1 tablet (200 mcg total) by mouth once for 1 dose. Night before procedure, Disp: 1 tablet, Rfl: 0 .  Multiple Vitamin (MULTIVITAMIN) capsule, Take 1 capsule by mouth daily. Doterra vitamins, Disp: , Rfl:  .  NP THYROID 30 MG tablet, , Disp: , Rfl:  .  phentermine (ADIPEX-P) 37.5 MG tablet, , Disp: , Rfl:  .  Probiotic Product (PROBIOTIC-10 PO), Take by mouth., Disp: , Rfl:  .  promethazine (PHENERGAN) 25 MG tablet, Take 1 tablet (25 mg total) by mouth once for 1 dose. One hour before procedure, Disp: 5 tablet, Rfl: 0 Allergies: Patient has no known allergies.  Review of Systems  All other systems reviewed and are negative.  Objective: BP 120/80   Ht 5' 9"  (1.753 m)   Wt 241 lb (109.3 kg)   LMP 04/17/2017   BMI 35.59 kg/m   Filed Weights   05/04/17 1624  Weight: 241 lb (109.3 kg)   Physical Exam  Constitutional: She is oriented to person, place, and time. She appears well-developed and well-nourished. No distress.  Musculoskeletal: Normal range of motion.  Neurological: She is alert and oriented to person,  place, and time.  Skin: Skin is warm and dry.  Psychiatric: She has a normal mood and affect.  Vitals reviewed.  Assessment: 1. Menorrhagia with irregular cycle   Plan ablation, pros and cons discussed, meds gv.  I have had a careful discussion with this patient about all the options available and the risk/benefits of each. I have fully informed this patient that surgery may subject her to a variety of discomforts and risks: She understands that most patients have surgery with little difficulty, but problems can happen ranging from minor to fatal. These include nausea, vomiting, pain, bleeding, infection, poor healing, hernia, or formation of adhesions. Unexpected reactions may occur from any drug or anesthetic given. Unintended injury may occur to  other pelvic or abdominal structures such as Fallopian tubes, ovaries, bladder, ureter (tube from kidney to bladder), or bowel. Nerves going from the pelvis to the legs may be injured. Any such injury may require immediate or later additional surgery to correct the problem. Excessive blood loss requiring transfusion is very unlikely but possible. Dangerous blood clots may form in the legs or lungs. Physical and sexual activity will be restricted in varying degrees for an indeterminate period of time but most often 2-6 weeks.  Finally, she understands that it is impossible to list every possible undesirable effect and that the condition for which surgery is done is not always cured or significantly improved, and in rare cases may be even worse.Ample time was given to answer all questions.  Barnett Applebaum, MD, [facility], [facility] 05/04/2017  4:35 PM

## 2017-05-04 NOTE — Patient Instructions (Signed)
  Endometrial Ablation Pre-Procedural Instructions for Patient   You may have a light meal prior to coming to the office if desired.    You can plan on your appointment taking about 1 hour.  The actual procedure lasts for 1/2 hour but you will need to remain in the office for a short period after the procedure.    You may feel drowsy from the medication administered prior to and during the procedure. You should arrange for transportation to and from the office.    The following medications have been prescribed to you.  Please follow the doctor's instructions in taking these medications:  Day before Procedure    Cytotec 200 mg vaginally at bedtime    Ibuprofen 800 mg one by mouth three times a day for 2 days before procedure    Day of Procedure  Phenergan 25 mg  by mouth 1 hour before procedure Demorol 76m 2 by mouth 1 hour before procedure Valium 591m 1 by mouth 1 hour before procedure Norco 5/325 mg 1 to 2 by mouth every 4-5 hours as needed for pain Ibuprofen 800 mg 1 by mouth every 8 hours as needed for pain

## 2017-05-05 ENCOUNTER — Telehealth: Payer: Self-pay

## 2017-05-05 ENCOUNTER — Other Ambulatory Visit: Payer: Self-pay | Admitting: Obstetrics & Gynecology

## 2017-05-05 MED ORDER — MEPERIDINE HCL 50 MG PO TABS: 100.0000 mg | ORAL_TABLET | Freq: Once | ORAL | 0 refills | Status: AC

## 2017-05-05 NOTE — Telephone Encounter (Signed)
[facility] [place] [facility] in [facility] and available. Due to narcotic must send new E-rx. Please send in.  Pt is aware. Spoke to [facility] and they have canceled rx. Thank you.

## 2017-05-05 NOTE — Telephone Encounter (Signed)
Pt aware rx being sent to [facility].

## 2017-05-05 NOTE — Telephone Encounter (Signed)
Have always used this medicine. Can u call and see if [facility] has it, then let pt know? If not, will have to determine alternative.

## 2017-05-05 NOTE — Telephone Encounter (Signed)
Will sen just Demoerol there unless I hear otherwise

## 2017-05-05 NOTE — Telephone Encounter (Signed)
Pharmacist from [facility] calling to notify [place] that rx for demerol 33m is unavailable at this time, drug is currently on back order. Is there a different rx that can be given or does pt need to try another pharmacy?  Please advise. Pharmacy cb# [phone] thank you

## 2017-05-07 ENCOUNTER — Encounter: Payer: Self-pay | Admitting: Obstetrics & Gynecology

## 2017-05-07 ENCOUNTER — Ambulatory Visit (INDEPENDENT_AMBULATORY_CARE_PROVIDER_SITE_OTHER): Payer: Managed Care, Other (non HMO) | Admitting: Obstetrics & Gynecology

## 2017-05-07 VITALS — BP 130/80 | HR 94 | Ht 69.0 in | Wt 242.0 lb

## 2017-05-07 DIAGNOSIS — N920 Excessive and frequent menstruation with regular cycle: Secondary | ICD-10-CM

## 2017-05-07 HISTORY — PX: ENDOMETRIAL ABLATION: SHX621

## 2017-05-07 NOTE — Progress Notes (Signed)
Operative Note   05/07/2017  PRE-OP DIAGNOSIS: Menorrhagia   POST-OP DIAGNOSIS: same   SURGEON: Barnett Applebaum, MD, FACOG   PROCEDURE: Hysteroscopy with endometrial ablation  ANESTHESIA: General   ESTIMATED BLOOD LOSS: min   FLUID DEFICIT: min   COMPLICATIONS: none   DISPOSITION: PACU - hemodynamically stable.   CONDITION: stable   FINDINGS: Exam under anesthesia revealed small, mobile small uterus with no masses and bilateral adnexa without masses or fullness. Hysteroscopy revealed no mass, otherwise grossly normal appearing uterine cavity with bilateral tubal ostia and normal appearing endocervical canal.   PROCEDURE IN DETAIL: After informed consent was obtained, the patient was taken to the operating room where anesthesia was obtained without difficulty. The patient was positioned in the dorsal lithotomy position in [place]. The patient's bladder was catheterized with an in and out foley catheter. The patient was examined under anesthesia, with the above noted findings. The weightedspeculum was placed inside the patient's vagina, and the the anterior lip of the cervix was seen and grasped with the tenaculum. An The uterine cavity was sounded to 8cm, and then the cervix was progressively dilated to a 16 French-Pratt dilator. The 30 degree hysteroscope was introduced, with LR fluid used to distend the intrauterine cavity, with the above noted findings.   The hystersocope was removed and the uterine cavity was curetted until a gritty texture was noted, yielding endometrial curettings. Repeat hysteroscopy performed, with improved contour and lining of uterus noted.  Excellent hemostasis was noted.   The ablation device was then placed without difficulty. Measurements were obtained. Patient was noted to have a uterine length of 8 cm, a cervical length of 3.5 cm. The device is first tested and after confirmation the procedures performed. Length of procedure was 120 seconds. The  device  is then removed.   Tenaculum was removed with excellent hemostasis noted. She was then taken out of dorsal lithotomy. Minimal discrepancy in fluid was noted. No bleeding.   The patient tolerated the procedure well. Sponge, lap and needle counts were correct x2. The patient was taken to recovery room in excellent condition.  Barnett Applebaum, MD, [facility], [facility] 05/07/2017  2:29 PM

## 2017-05-07 NOTE — Patient Instructions (Signed)
    Endometrial Ablation Post-Procedural Instructions for Patient   You may experience mild to moderate cramping (like menstrual cramping) and pinkish watery discharge.  This may last approximately 2 to 3 weeks. Use pads, not tampons during this time.  No sexual activity for 3 weeks post procedure.  Follow up medications and directions for taking the medications are    NORCO 5/500 1 or 2 by mouth every 4 to 6 hours as needed IBUPROFEN 800 mg by mouth three times a day for the next two days PHENERGAN 25 mg by mouth q 6 hours for nausea       Call our office if you develop any of the following: ? Fever of 100.4 or greater  ? Worsening pelvic pain  ? Nausea  ? Vomiting  ? Greenish vaginal discharge with odor   Office phone number: [phone]

## 2017-05-21 ENCOUNTER — Ambulatory Visit (INDEPENDENT_AMBULATORY_CARE_PROVIDER_SITE_OTHER): Payer: Managed Care, Other (non HMO) | Admitting: Obstetrics & Gynecology

## 2017-05-21 ENCOUNTER — Encounter: Payer: Self-pay | Admitting: Obstetrics & Gynecology

## 2017-05-21 VITALS — BP 120/80 | Ht 69.0 in | Wt 240.0 lb

## 2017-05-21 DIAGNOSIS — N920 Excessive and frequent menstruation with regular cycle: Secondary | ICD-10-CM

## 2017-05-21 NOTE — Progress Notes (Signed)
  Follow-up Patient presents with dx of menorrhagia and also follow up from recent office based ablation procedure for abnormal uterine bleeding, 2 weeks ago.  Subjective: Patient reports no bleeding and has some discharge since the procedure; no pain. Eating a regular diet without difficulty. The patient is not having any pain.  Activity: normal activities of daily living. Patient reports vaginal sx's of No bleeding but somewhat of a discharge.  Objective: BP 120/80   Ht 5' 9"  (1.753 m)   Wt 240 lb (108.9 kg)   BMI 35.44 kg/m  Physical Exam  Constitutional: She is oriented to person, place, and time. She appears well-developed and well-nourished. No distress.  Musculoskeletal: Normal range of motion.  Neurological: She is alert and oriented to person, place, and time.  Skin: Skin is warm and dry.  Psychiatric: She has a normal mood and affect.  Vitals reviewed.  Assessment: Prior history of menorrhagia Recent endometrial ablation - progressing well  Plan: Patient has done well after surgery with no apparent complications.  I have discussed the post-operative course to date, and the expected progress moving forward.  The patient understands what complications to be concerned about.  I will see the patient in routine follow up, or sooner if needed.    Activity plan: No restriction.  Hoyt Koch 05/21/2017, 4:54 PM

## 2017-09-29 IMAGING — CT CT HEAD W/O CM
1 series · 16 of 30 positions shown, 20 images · non-contrast
Comparison: Head CT dated 03/14/2013

CLINICAL DATA: 44-year-old female with right-sided headache

EXAM:
CT HEAD WITHOUT CONTRAST
TECHNIQUE: Contiguous axial images were obtained from the base of the skull
through the vertex without intravenous contrast.

[Series 2: head wo · axial · 0.44mm/px · z∈[-622,-493]mm · 16 of 32 slices shown, 20 images]
[im 2/32  brain]
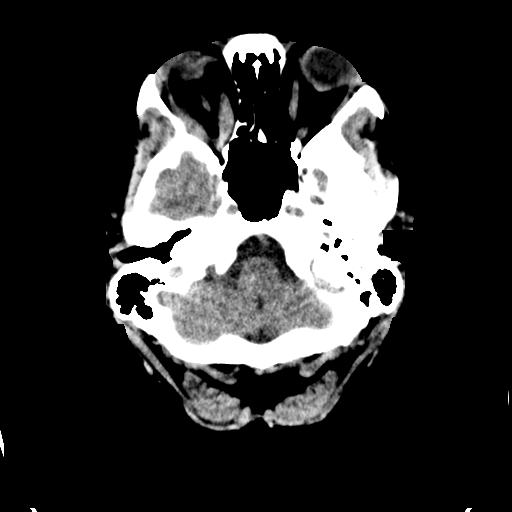
[im 2/32  bone]
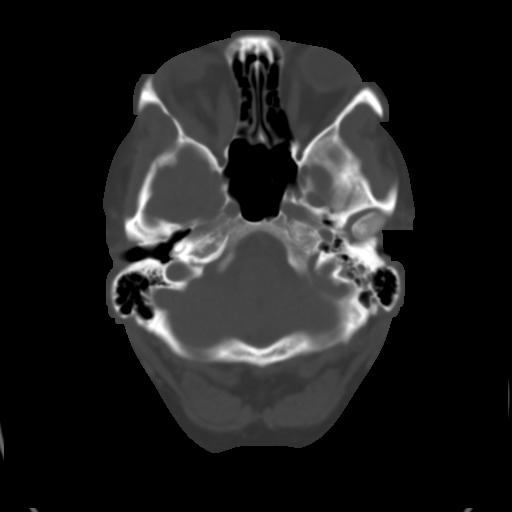
[im 4/32  brain]
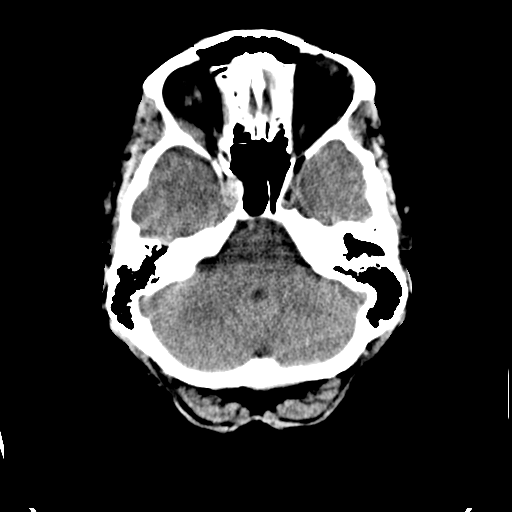
[im 6/32  brain]
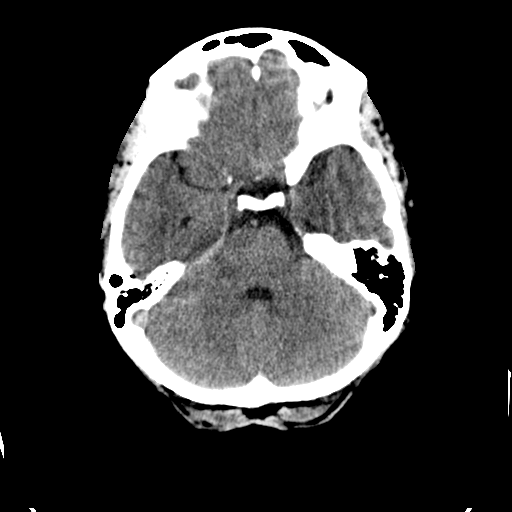
[im 8/32  brain]
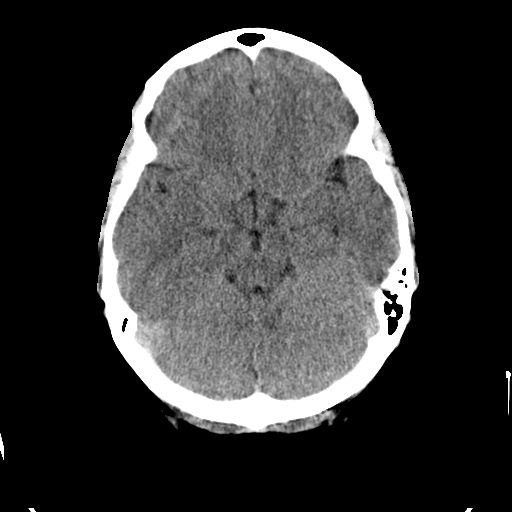
[im 9/32  brain]
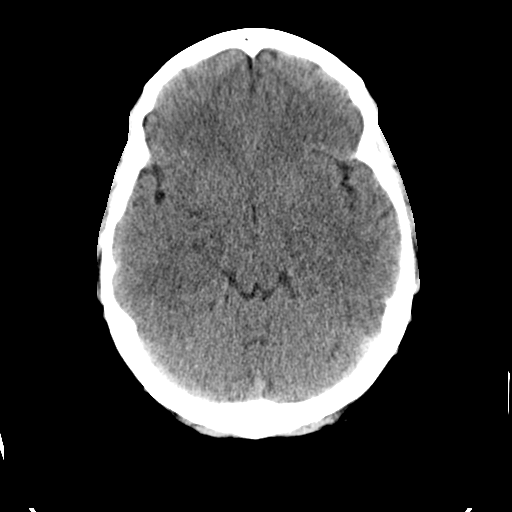
[im 9/32  bone]
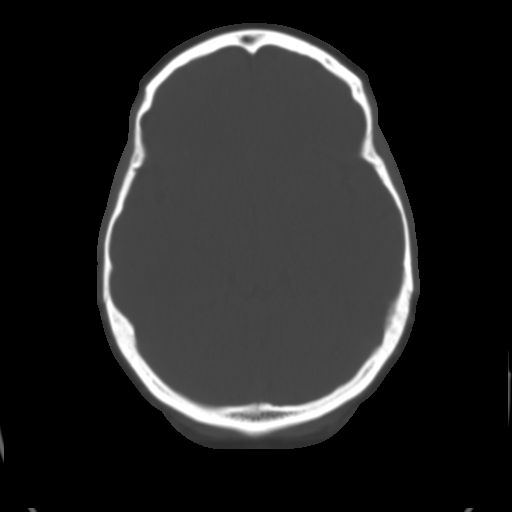
[im 11/32  brain]
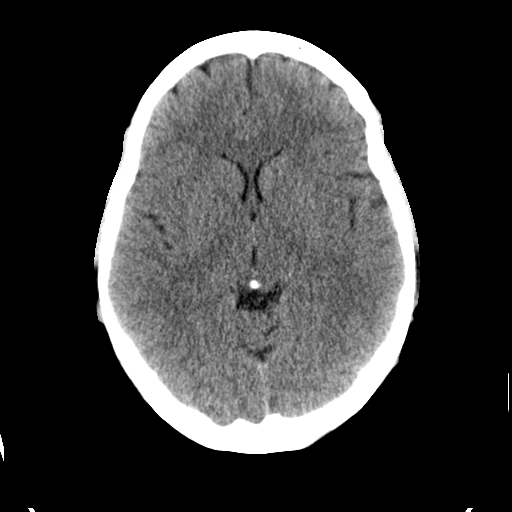
[im 13/32  brain]
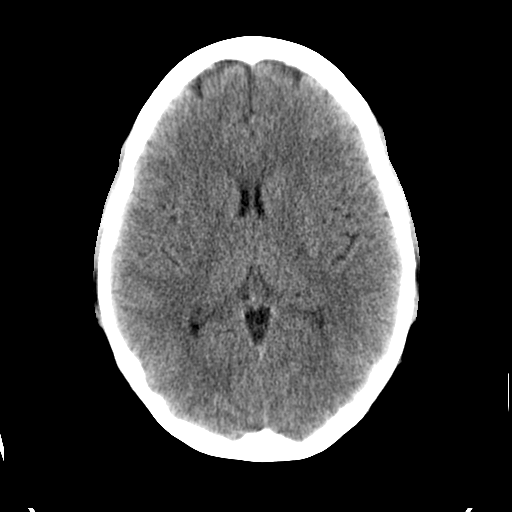
[im 15/32  brain]
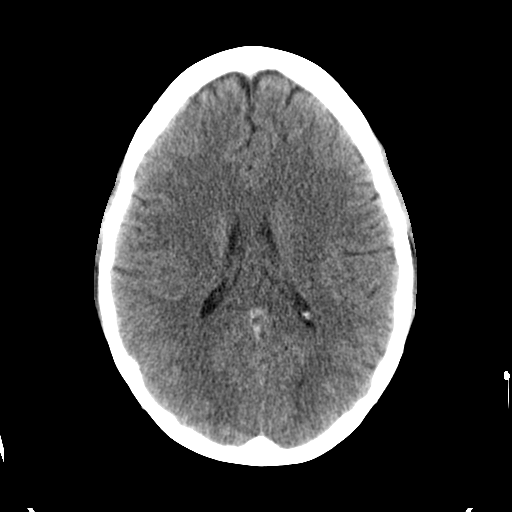
[im 17/32  brain]
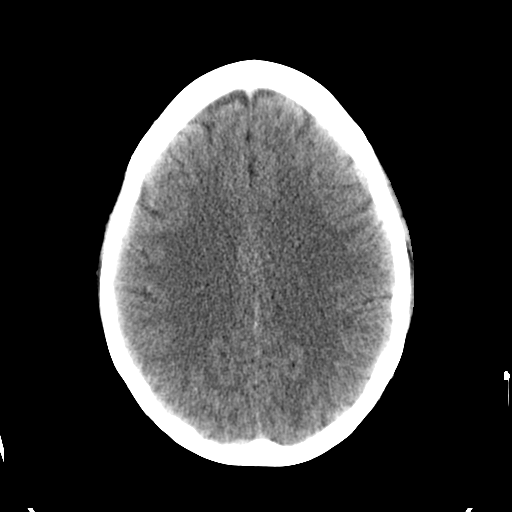
[im 17/32  bone]
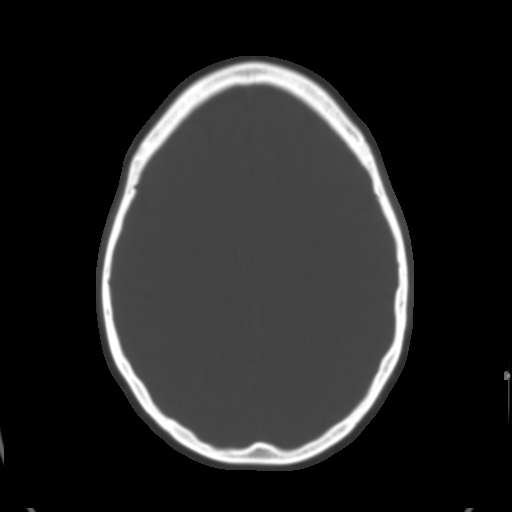
[im 19/32  brain]
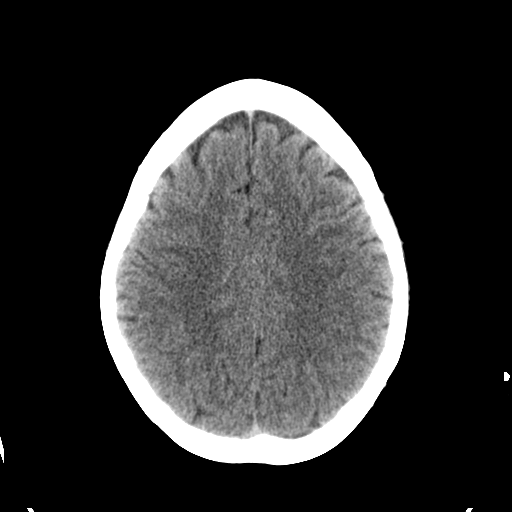
[im 21/32  brain]
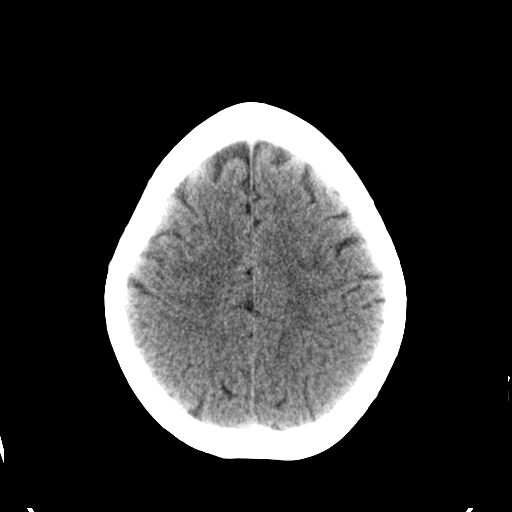
[im 23/32  brain]
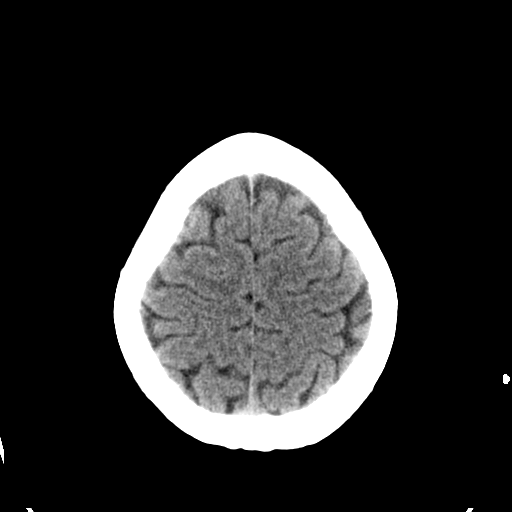
[im 24/32  brain]
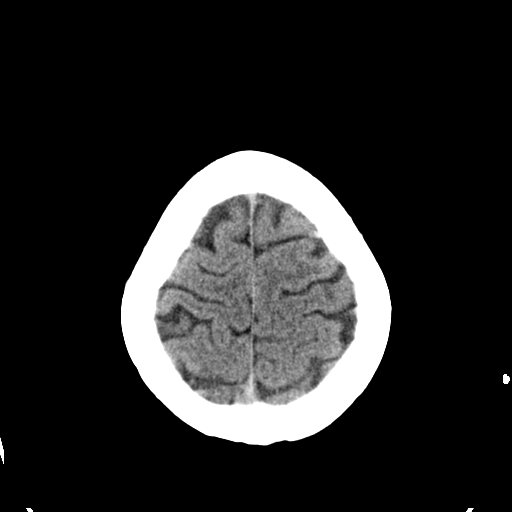
[im 24/32  bone]
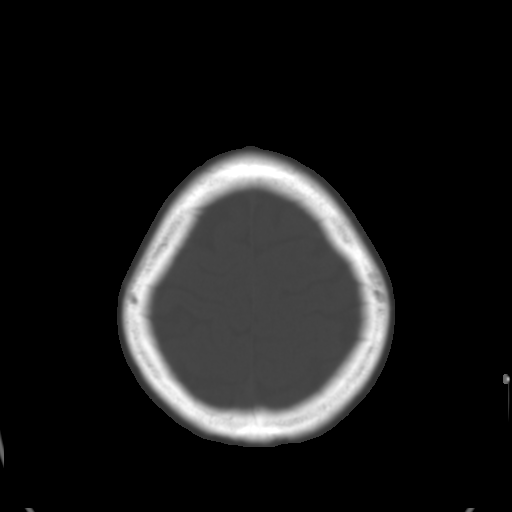
[im 26/32  brain]
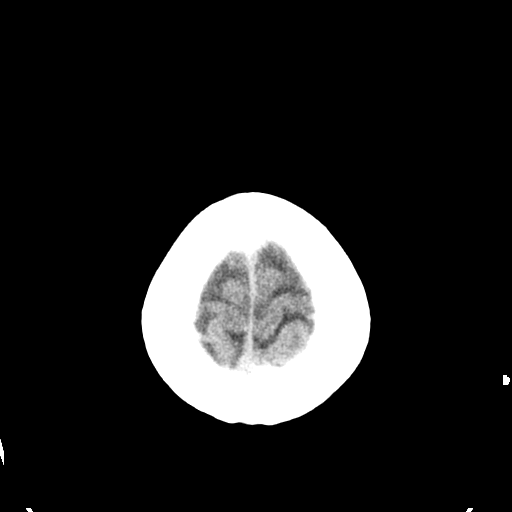
[im 28/32  brain]
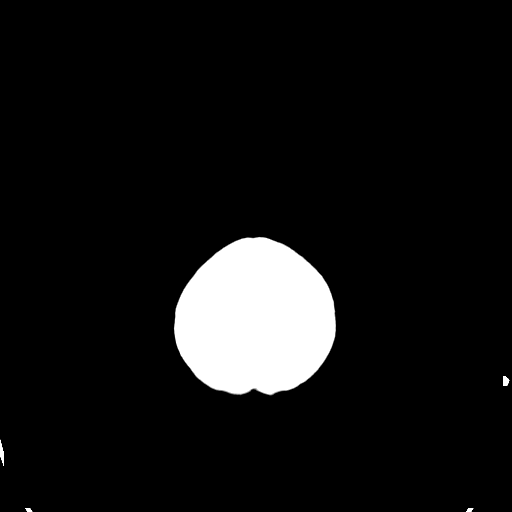
[im 30/32  brain]
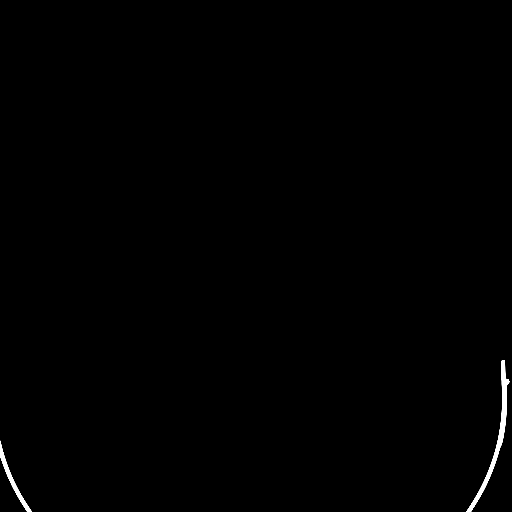

[16 of 30 positions shown; findings below may reference images not displayed]

FINDINGS: The ventricles and the sulci are appropriate in size for the
patient's age. There is no intracranial hemorrhage. No midline shift
or mass effect identified. The gray-white matter differentiation is
preserved.

The visualized paranasal sinuses and mastoid air cells are well
aerated. The calvarium is intact.

There are thickened appearance of the extraocular muscles
predominantly on the right compatible with thyroid opthalmopathy.
IMPRESSION: No acute intracranial pathology.

Thickened right extraocular muscles compatible with thyroid
opthalmopathy.

## 2017-11-20 DIAGNOSIS — H5021 Vertical strabismus, right eye: Secondary | ICD-10-CM | POA: Insufficient documentation

## 2018-03-04 HISTORY — PX: STRABISMUS SURGERY: SHX218

## 2018-03-08 ENCOUNTER — Other Ambulatory Visit: Payer: Self-pay | Admitting: Certified Nurse Midwife

## 2018-03-08 DIAGNOSIS — Z1231 Encounter for screening mammogram for malignant neoplasm of breast: Secondary | ICD-10-CM

## 2018-04-23 ENCOUNTER — Ambulatory Visit: Payer: Managed Care, Other (non HMO) | Admitting: Certified Nurse Midwife

## 2018-05-05 DIAGNOSIS — E668 Other obesity: Secondary | ICD-10-CM | POA: Diagnosis not present

## 2018-05-05 DIAGNOSIS — E559 Vitamin D deficiency, unspecified: Secondary | ICD-10-CM | POA: Diagnosis not present

## 2018-05-05 DIAGNOSIS — E063 Autoimmune thyroiditis: Secondary | ICD-10-CM | POA: Diagnosis not present

## 2018-05-05 DIAGNOSIS — R5383 Other fatigue: Secondary | ICD-10-CM | POA: Diagnosis not present

## 2018-05-05 DIAGNOSIS — E538 Deficiency of other specified B group vitamins: Secondary | ICD-10-CM | POA: Diagnosis not present

## 2018-05-05 DIAGNOSIS — R946 Abnormal results of thyroid function studies: Secondary | ICD-10-CM | POA: Diagnosis not present

## 2018-05-13 DIAGNOSIS — R7982 Elevated C-reactive protein (CRP): Secondary | ICD-10-CM | POA: Diagnosis not present

## 2018-05-13 DIAGNOSIS — E063 Autoimmune thyroiditis: Secondary | ICD-10-CM | POA: Diagnosis not present

## 2018-05-13 DIAGNOSIS — E538 Deficiency of other specified B group vitamins: Secondary | ICD-10-CM | POA: Diagnosis not present

## 2018-05-13 DIAGNOSIS — H0589 Other disorders of orbit: Secondary | ICD-10-CM | POA: Diagnosis not present

## 2018-05-13 DIAGNOSIS — R5383 Other fatigue: Secondary | ICD-10-CM | POA: Diagnosis not present

## 2018-05-13 DIAGNOSIS — Z6836 Body mass index (BMI) 36.0-36.9, adult: Secondary | ICD-10-CM | POA: Diagnosis not present

## 2018-05-13 DIAGNOSIS — R69 Illness, unspecified: Secondary | ICD-10-CM | POA: Diagnosis not present

## 2018-05-13 DIAGNOSIS — I10 Essential (primary) hypertension: Secondary | ICD-10-CM | POA: Diagnosis not present

## 2018-05-13 DIAGNOSIS — E559 Vitamin D deficiency, unspecified: Secondary | ICD-10-CM | POA: Diagnosis not present

## 2018-05-13 DIAGNOSIS — E668 Other obesity: Secondary | ICD-10-CM | POA: Diagnosis not present

## 2018-06-09 NOTE — Progress Notes (Addendum)
Gynecology Annual Exam  PCP: [place], [place] Chief Complaint:  Chief Complaint  Patient presents with  . Annual Exam    History of Present Illness: Patient is a 48 y.o. MWF, G1P1, who presents for her annual gyn exam. The patient has no new gyn complaints today. Has had problems with heavy menses since 2012 and last year she had an endometrial ablation 05/07/2017. Since the ablation, her menses are every month and only last 2 days and are light flow/ spotting. Last month she had a heavier menses lasting 7 days. LMP: Patient's last menstrual period was 06/03/2018. Denies dysmenorrhea. The patient is sexually active. She currently uses vasectomy  for contraception.    The patient's past medical history is notable for a history of hypothyroidism following radioactive ablation in November 1994 for Graves disease. She has exophthalmos and has had multiple eye surgeries for decompression since December 2015 at [facility]. This surgery caused some diplopia and she had other surgeries to try to correct this problem. The last eye surgery in February 2020 almost completely resolved her diplopia, but she has had some diplopia return recently when she gazes laterally or if she looks down.   Her past medical history is also significant for melanoma which was removed from behind her left thigh. She is followed by her dermatologist every 3-6 months. Her past medical history is also remarkable for obesity, hypertension, and depression. She has started seeing PA Celine Mans at [facility]. She has lost about 40# over the last 2 year using Adipex and Metformin.   Her most recent Pap smear was obtained 04/02/2017 and was a NIL. Her most recent mammogram was on 03/30/2017 and was Birads 0 due to some asymmetry in the left breast. Additional views and ultrasound were done and were negative (Birads 1). There is a positive history of breast cancer in her paternal aunt and maternal  grandmother. Genetic testing has not been done. There is no family history of ovarian cancer. The patient does not do monthly self breast exams. She does not smoke or drink alcohol. The patient exercises regularly. She does get adequate calcium in her diet and vitamin supplement. She had a recent cholesterol screening in 12/2016 that was borderline (238 TC, 174 tri, 52.3 HDL, 151 LDL).  Review of Systems: Review of Systems  Constitutional: Positive for weight loss (intentional ). Negative for chills and fever.  HENT: Negative for sore throat.   Eyes: Positive for blurred vision and double vision. Negative for pain (eye irritation).  Respiratory: Negative for hemoptysis, shortness of breath and wheezing.   Cardiovascular: Negative for chest pain, palpitations and leg swelling.  Gastrointestinal: Negative for abdominal pain, blood in stool, diarrhea, heartburn, nausea and vomiting.  Genitourinary: Negative for dysuria, frequency, hematuria and urgency.  Musculoskeletal: Positive for joint pain (and swelling). Negative for back pain and myalgias.       Positive for muscle weakness  Skin: Positive for rash (eczema). Negative for itching.  Neurological: Negative for dizziness, tingling and headaches.  Endo/Heme/Allergies: Negative for environmental allergies and polydipsia. Does not bruise/bleed easily.       Positive for hot flashes and hot/cold intolerance   Psychiatric/Behavioral: Negative for depression. The patient is not nervous/anxious and does not have insomnia.     Past Medical History:  Past Medical History:  Diagnosis Date  . Depression   . Graves disease   . Hypertension   . Hypothyroidism    following iodine ablation  .  Melanoma ([employer])   . Menorrhagia   . Obesity   . Thyroid eye disease     Past Surgical History:  Past Surgical History:  Procedure Laterality Date  . ENDOMETRIAL ABLATION  05/07/2017   Dr Kenton Kingfisher  . ENDOMETRIAL BIOPSY  2012   proliferative with focal  breakdown  . EYE SURGERY     multiple eye decompression surgeries from 2015 till present  . radioactive iodine ablation     Graves disease  . STRABISMUS SURGERY  03/04/2018    Obstetric History: G1P1  Family History:  Family History  Problem Relation Age of Onset  . Thyroid disease Father   . Hypertension Father   . Skin cancer Father   . Dementia Maternal Grandmother        dementia  . Breast cancer Maternal Grandmother 95  . Stroke Paternal Grandfather   . Glaucoma Mother   . Hypertension Mother   . Breast cancer Paternal Aunt 2  . Cancer Cousin 12    Social History:  Social History   Socioeconomic History  . Marital status: Married    Spouse name: Not on file  . Number of children: 1  . Years of education: Not on file  . Highest education level: Not on file  Occupational History  . Occupation: Education officer, museum  Social Needs  . Financial resource strain: Not on file  . Food insecurity:    Worry: Not on file    Inability: Not on file  . Transportation needs:    Medical: Not on file    Non-medical: Not on file  Tobacco Use  . Smoking status: Never Smoker  . Smokeless tobacco: Never Used  Substance and Sexual Activity  . Alcohol use: No  . Drug use: No  . Sexual activity: Yes    Partners: Male    Birth control/protection: Condom, Surgical    Comment: Vasectomy  Lifestyle  . Physical activity:    Days per week: 4 days    Minutes per session: 60 min  . Stress: Not at all  Relationships  . Social connections:    Talks on phone: Not on file    Gets together: Not on file    Attends religious service: Not on file    Active member of club or organization: Not on file    Attends meetings of clubs or organizations: Not on file    Relationship status: Not on file  . Intimate partner violence:    Fear of current or ex partner: Not on file    Emotionally abused: Not on file    Physically abused: Not on file    Forced sexual activity: Not on file  Other Topics  Concern  . Not on file  Social History Narrative  . Not on file    Allergies:  No Known Allergies  Medications:  Current Outpatient Medications on File Prior to Visit  Medication Sig Dispense Refill  . Cyanocobalamin (VITAMIN B-12) 5000 MCG SUBL Place under the tongue.    . hydrochlorothiazide (HYDRODIURIL) 25 MG tablet     . levOCARNitine (L-CARNITINE) 250 MG CAPS Take by mouth.    Marland Kitchen MAGNESIUM OXIDE 400 PO Take by mouth.    . metFORMIN (GLUCOPHAGE) 500 MG tablet Take by mouth.    . NP THYROID 30 MG tablet     . phentermine (ADIPEX-P) 37.5 MG tablet     . Probiotic Product (PROBIOTIC-10 PO) Take by mouth.    . Multiple Vitamin (MULTIVITAMIN) capsule Take 1 capsule  by mouth daily. Doterra vitamins    . promethazine (PHENERGAN) 25 MG tablet Take 1 tablet (25 mg total) by mouth once for 1 dose. One hour before procedure 5 tablet 0   No current facility-administered medications on file prior to visit.         Physical Exam Vitals: BP 128/88   Ht 5' 9"  (1.753 m)   Wt 230 lb (104.3 kg)   LMP 06/03/2018   BMI 33.97 kg/m  General: pleasant, WF, in NAD HEENT: normocephalic, anicteric.  Thyroid: no enlargement, no palpable nodules Pulmonary: No increased work of breathing, CTAB Cardiovascular: RRR without murmur Breast: Breast symmetrical, no tenderness, no palpable nodules or masses, no skin or nipple retraction present, no nipple discharge.  No axillary, infraclavicular, or supraclavicular lymphadenopathy. Abdomen:obese, soft, non-tender, non-distended.  Umbilicus without lesions.  No hepatomegaly,  or masses palpable. No evidence of hernia  Genitourinary:  External: Normal external female genitalia.  Normal urethral meatus, normal Bartholin's and Skene's glands.    Vagina: Normal vaginal mucosa, no evidence of prolapse.    Cervix: Grossly normal in appearance, no bleeding  Uterus: Non-enlarged, mobile, normal contour. anteverted to midplane  Adnexa: ovaries non-enlarged, no  adnexal masses  Rectal: deferred  Lymphatic: no evidence of inguinal lymphadenopathy Extremities: no edema, erythema, or tenderness Neurologic: Grossly intact Psychiatric: mood appropriate, affect full    Assessment: 48 y.o. G1P1 well woman exam Menorrhagia-resolved with ablation   Plan:   1) Breast cancer screening: Mamm[facility] at [facility] scheduled for 1 July  2) Pap smear not done. Would like to do Pap smears every 3 years. Will get Pap with [facility] next year then every 3 years.  3) Osteoporosis prevention- Continue exercise program.   4) Routine healthcare maintenance including cholesterol, diabetes screening per PCP    5) Colon cancer screening: Discussed options including Cologuard, annual FIT testing, or colonoscopy. Would like to screen with Cologuard. Order faxed to [employer].  6) RTO 1 year and prn.  Dalia Heading, CNM

## 2018-06-10 ENCOUNTER — Ambulatory Visit (INDEPENDENT_AMBULATORY_CARE_PROVIDER_SITE_OTHER): Payer: 59 | Admitting: Certified Nurse Midwife

## 2018-06-10 ENCOUNTER — Encounter: Payer: Self-pay | Admitting: Certified Nurse Midwife

## 2018-06-10 ENCOUNTER — Other Ambulatory Visit: Payer: Self-pay

## 2018-06-10 VITALS — BP 128/88 | Ht 69.0 in | Wt 230.0 lb

## 2018-06-10 DIAGNOSIS — L308 Other specified dermatitis: Secondary | ICD-10-CM | POA: Diagnosis not present

## 2018-06-10 DIAGNOSIS — Z1211 Encounter for screening for malignant neoplasm of colon: Secondary | ICD-10-CM

## 2018-06-10 DIAGNOSIS — L578 Other skin changes due to chronic exposure to nonionizing radiation: Secondary | ICD-10-CM | POA: Diagnosis not present

## 2018-06-10 DIAGNOSIS — Z01419 Encounter for gynecological examination (general) (routine) without abnormal findings: Secondary | ICD-10-CM | POA: Diagnosis not present

## 2018-06-10 DIAGNOSIS — Z859 Personal history of malignant neoplasm, unspecified: Secondary | ICD-10-CM | POA: Diagnosis not present

## 2018-06-10 DIAGNOSIS — L905 Scar conditions and fibrosis of skin: Secondary | ICD-10-CM | POA: Diagnosis not present

## 2018-06-10 DIAGNOSIS — Z86018 Personal history of other benign neoplasm: Secondary | ICD-10-CM | POA: Diagnosis not present

## 2018-06-10 DIAGNOSIS — Z1239 Encounter for other screening for malignant neoplasm of breast: Secondary | ICD-10-CM | POA: Diagnosis not present

## 2018-06-10 DIAGNOSIS — Z85820 Personal history of malignant melanoma of skin: Secondary | ICD-10-CM | POA: Diagnosis not present

## 2018-07-07 ENCOUNTER — Ambulatory Visit
Admission: RE | Admit: 2018-07-07 | Discharge: 2018-07-07 | Disposition: A | Discharge status: Home / Self Care | Payer: 59 | Source: Ambulatory Visit | Attending: Certified Nurse Midwife | Admitting: Certified Nurse Midwife

## 2018-07-07 ENCOUNTER — Other Ambulatory Visit: Payer: Self-pay

## 2018-07-07 DIAGNOSIS — Z1231 Encounter for screening mammogram for malignant neoplasm of breast: Secondary | ICD-10-CM | POA: Diagnosis not present

## 2018-07-29 DIAGNOSIS — E0500 Thyrotoxicosis with diffuse goiter without thyrotoxic crisis or storm: Secondary | ICD-10-CM | POA: Diagnosis not present

## 2018-08-11 ENCOUNTER — Telehealth: Payer: Self-pay

## 2018-08-11 NOTE — Telephone Encounter (Signed)
Left detailed msg reminding pt to send in [place].

## 2018-08-24 DIAGNOSIS — E538 Deficiency of other specified B group vitamins: Secondary | ICD-10-CM | POA: Diagnosis not present

## 2018-08-24 DIAGNOSIS — R946 Abnormal results of thyroid function studies: Secondary | ICD-10-CM | POA: Diagnosis not present

## 2018-08-24 DIAGNOSIS — E559 Vitamin D deficiency, unspecified: Secondary | ICD-10-CM | POA: Diagnosis not present

## 2018-08-24 DIAGNOSIS — E063 Autoimmune thyroiditis: Secondary | ICD-10-CM | POA: Diagnosis not present

## 2018-08-24 DIAGNOSIS — R5383 Other fatigue: Secondary | ICD-10-CM | POA: Diagnosis not present

## 2018-08-24 DIAGNOSIS — E669 Obesity, unspecified: Secondary | ICD-10-CM | POA: Diagnosis not present

## 2018-08-29 ENCOUNTER — Encounter: Payer: Self-pay | Admitting: Certified Nurse Midwife

## 2018-08-29 DIAGNOSIS — Z1212 Encounter for screening for malignant neoplasm of rectum: Secondary | ICD-10-CM | POA: Diagnosis not present

## 2018-08-29 DIAGNOSIS — Z1211 Encounter for screening for malignant neoplasm of colon: Secondary | ICD-10-CM | POA: Diagnosis not present

## 2018-09-01 DIAGNOSIS — Z6836 Body mass index (BMI) 36.0-36.9, adult: Secondary | ICD-10-CM | POA: Diagnosis not present

## 2018-09-01 DIAGNOSIS — R69 Illness, unspecified: Secondary | ICD-10-CM | POA: Diagnosis not present

## 2018-09-01 DIAGNOSIS — R5383 Other fatigue: Secondary | ICD-10-CM | POA: Diagnosis not present

## 2018-09-01 DIAGNOSIS — R7982 Elevated C-reactive protein (CRP): Secondary | ICD-10-CM | POA: Diagnosis not present

## 2018-09-01 DIAGNOSIS — E063 Autoimmune thyroiditis: Secondary | ICD-10-CM | POA: Diagnosis not present

## 2018-09-01 DIAGNOSIS — I10 Essential (primary) hypertension: Secondary | ICD-10-CM | POA: Diagnosis not present

## 2018-09-01 DIAGNOSIS — E538 Deficiency of other specified B group vitamins: Secondary | ICD-10-CM | POA: Diagnosis not present

## 2018-09-01 DIAGNOSIS — H0589 Other disorders of orbit: Secondary | ICD-10-CM | POA: Diagnosis not present

## 2018-09-01 DIAGNOSIS — E668 Other obesity: Secondary | ICD-10-CM | POA: Diagnosis not present

## 2018-09-01 DIAGNOSIS — E559 Vitamin D deficiency, unspecified: Secondary | ICD-10-CM | POA: Diagnosis not present

## 2018-09-04 LAB — COLOGUARD

## 2018-09-21 DIAGNOSIS — M5387 Other specified dorsopathies, lumbosacral region: Secondary | ICD-10-CM | POA: Diagnosis not present

## 2018-09-21 DIAGNOSIS — M9902 Segmental and somatic dysfunction of thoracic region: Secondary | ICD-10-CM | POA: Diagnosis not present

## 2018-09-21 DIAGNOSIS — M531 Cervicobrachial syndrome: Secondary | ICD-10-CM | POA: Diagnosis not present

## 2018-11-03 DIAGNOSIS — H532 Diplopia: Secondary | ICD-10-CM | POA: Diagnosis not present

## 2018-11-03 DIAGNOSIS — H0222C Mechanical lagophthalmos, bilateral, upper and lower eyelids: Secondary | ICD-10-CM | POA: Insufficient documentation

## 2018-11-03 DIAGNOSIS — H5021 Vertical strabismus, right eye: Secondary | ICD-10-CM | POA: Diagnosis not present

## 2018-11-03 DIAGNOSIS — E0500 Thyrotoxicosis with diffuse goiter without thyrotoxic crisis or storm: Secondary | ICD-10-CM | POA: Diagnosis not present

## 2018-11-03 DIAGNOSIS — Z9889 Other specified postprocedural states: Secondary | ICD-10-CM | POA: Diagnosis not present

## 2018-11-22 DIAGNOSIS — E639 Nutritional deficiency, unspecified: Secondary | ICD-10-CM | POA: Diagnosis not present

## 2018-11-22 DIAGNOSIS — E063 Autoimmune thyroiditis: Secondary | ICD-10-CM | POA: Diagnosis not present

## 2018-11-22 DIAGNOSIS — E559 Vitamin D deficiency, unspecified: Secondary | ICD-10-CM | POA: Diagnosis not present

## 2018-11-22 DIAGNOSIS — R946 Abnormal results of thyroid function studies: Secondary | ICD-10-CM | POA: Diagnosis not present

## 2018-11-22 DIAGNOSIS — R5383 Other fatigue: Secondary | ICD-10-CM | POA: Diagnosis not present

## 2018-11-22 DIAGNOSIS — E538 Deficiency of other specified B group vitamins: Secondary | ICD-10-CM | POA: Diagnosis not present

## 2018-11-22 DIAGNOSIS — E669 Obesity, unspecified: Secondary | ICD-10-CM | POA: Diagnosis not present

## 2018-12-09 DIAGNOSIS — R69 Illness, unspecified: Secondary | ICD-10-CM | POA: Diagnosis not present

## 2018-12-09 DIAGNOSIS — I10 Essential (primary) hypertension: Secondary | ICD-10-CM | POA: Diagnosis not present

## 2018-12-09 DIAGNOSIS — R5383 Other fatigue: Secondary | ICD-10-CM | POA: Diagnosis not present

## 2018-12-09 DIAGNOSIS — Z6836 Body mass index (BMI) 36.0-36.9, adult: Secondary | ICD-10-CM | POA: Diagnosis not present

## 2018-12-09 DIAGNOSIS — E538 Deficiency of other specified B group vitamins: Secondary | ICD-10-CM | POA: Diagnosis not present

## 2018-12-09 DIAGNOSIS — H0589 Other disorders of orbit: Secondary | ICD-10-CM | POA: Diagnosis not present

## 2018-12-09 DIAGNOSIS — E559 Vitamin D deficiency, unspecified: Secondary | ICD-10-CM | POA: Diagnosis not present

## 2018-12-09 DIAGNOSIS — E063 Autoimmune thyroiditis: Secondary | ICD-10-CM | POA: Diagnosis not present

## 2018-12-09 DIAGNOSIS — E668 Other obesity: Secondary | ICD-10-CM | POA: Diagnosis not present

## 2018-12-09 DIAGNOSIS — R7982 Elevated C-reactive protein (CRP): Secondary | ICD-10-CM | POA: Diagnosis not present

## 2018-12-13 DIAGNOSIS — Z85820 Personal history of malignant melanoma of skin: Secondary | ICD-10-CM | POA: Diagnosis not present

## 2018-12-13 DIAGNOSIS — Z85828 Personal history of other malignant neoplasm of skin: Secondary | ICD-10-CM | POA: Diagnosis not present

## 2018-12-13 DIAGNOSIS — Z86018 Personal history of other benign neoplasm: Secondary | ICD-10-CM | POA: Diagnosis not present

## 2018-12-13 DIAGNOSIS — L578 Other skin changes due to chronic exposure to nonionizing radiation: Secondary | ICD-10-CM | POA: Diagnosis not present

## 2019-01-27 DIAGNOSIS — Z20828 Contact with and (suspected) exposure to other viral communicable diseases: Secondary | ICD-10-CM | POA: Diagnosis not present

## 2019-01-27 DIAGNOSIS — Z20822 Contact with and (suspected) exposure to covid-19: Secondary | ICD-10-CM | POA: Diagnosis not present

## 2019-02-05 DIAGNOSIS — Z20822 Contact with and (suspected) exposure to covid-19: Secondary | ICD-10-CM | POA: Diagnosis not present

## 2019-03-21 DIAGNOSIS — R7982 Elevated C-reactive protein (CRP): Secondary | ICD-10-CM | POA: Diagnosis not present

## 2019-03-21 DIAGNOSIS — R946 Abnormal results of thyroid function studies: Secondary | ICD-10-CM | POA: Diagnosis not present

## 2019-03-21 DIAGNOSIS — E668 Other obesity: Secondary | ICD-10-CM | POA: Diagnosis not present

## 2019-03-21 DIAGNOSIS — R5383 Other fatigue: Secondary | ICD-10-CM | POA: Diagnosis not present

## 2019-03-21 DIAGNOSIS — E538 Deficiency of other specified B group vitamins: Secondary | ICD-10-CM | POA: Diagnosis not present

## 2019-03-21 DIAGNOSIS — R7309 Other abnormal glucose: Secondary | ICD-10-CM | POA: Diagnosis not present

## 2019-03-21 DIAGNOSIS — E063 Autoimmune thyroiditis: Secondary | ICD-10-CM | POA: Diagnosis not present

## 2019-03-21 DIAGNOSIS — E559 Vitamin D deficiency, unspecified: Secondary | ICD-10-CM | POA: Diagnosis not present

## 2019-03-24 ENCOUNTER — Other Ambulatory Visit: Payer: Self-pay | Admitting: Certified Nurse Midwife

## 2019-03-24 DIAGNOSIS — Z1231 Encounter for screening mammogram for malignant neoplasm of breast: Secondary | ICD-10-CM

## 2019-03-27 ENCOUNTER — Ambulatory Visit: Payer: 59 | Attending: Internal Medicine

## 2019-03-27 DIAGNOSIS — Z23 Encounter for immunization: Secondary | ICD-10-CM

## 2019-03-27 NOTE — Progress Notes (Signed)
   Covid-19 Vaccination Clinic  Name:  MANJOT BEUMER    MRN: [id number] DOB: Dec 16, 1970  03/27/2019  Ms. Woolen was observed post Covid-19 immunization for 15 minutes without incident. She was provided with Vaccine Information Sheet and instruction to access the V-Safe system.   Ms. Watrous was instructed to call 911 with any severe reactions post vaccine: Marland Kitchen Difficulty breathing  . Swelling of face and throat  . A fast heartbeat  . A bad rash all over body  . Dizziness and weakness   Immunizations Administered    Name Date Dose VIS Date Route   Pfizer COVID-19 Vaccine 03/27/2019  3:53 PM 0.3 mL 12/17/2018 Intramuscular   Manufacturer: [place]   Lot: [id number]   [place]: [id number]

## 2019-04-17 ENCOUNTER — Ambulatory Visit: Payer: 59 | Attending: Internal Medicine

## 2019-04-17 DIAGNOSIS — Z23 Encounter for immunization: Secondary | ICD-10-CM

## 2019-04-17 NOTE — Progress Notes (Signed)
   Covid-19 Vaccination Clinic  Name:  Lauren Higgins    MRN: [id number] DOB: 06-Aug-1970  04/17/2019  Ms. Lauren Higgins was observed post Covid-19 immunization for 15 minutes without incident. She was provided with Vaccine Information Sheet and instruction to access the V-Safe system.   Ms. Lauren Higgins was instructed to call 911 with any severe reactions post vaccine: Marland Kitchen Difficulty breathing  . Swelling of face and throat  . A fast heartbeat  . A bad rash all over body  . Dizziness and weakness   Immunizations Administered    Name Date Dose VIS Date Route   Pfizer COVID-19 Vaccine 04/17/2019  3:31 PM 0.3 mL 12/17/2018 Intramuscular   Manufacturer: [employer], [employer]   Lot: [id number]   [place]: [id number]

## 2019-05-06 ENCOUNTER — Encounter: Payer: Self-pay | Admitting: *Deleted

## 2019-05-06 ENCOUNTER — Other Ambulatory Visit: Payer: Self-pay | Admitting: *Deleted

## 2019-05-06 NOTE — Patient Outreach (Signed)
[place] [facility]) Care Management [facility] CM Telephone Outreach, Routine insurance referral  05/06/2019  [name] 02-27-1970 [id number]  Unsuccessful telephone Outreach to Lauren Higgins, 49 y/o female referred to [facility] RN CM by [facility] CMA on Monday May 02, 2019 for insurance referral/ [employer] HTN iniative.  Patient has history including, but not limited to, hypothyroidism; Graves Disease; HTN; and depression.  Patient has had no recent hospitalizations.  HIPAA compliant voice mail message left for patient, requesting return call back.  Plan:  Will place [facility] Community CM unsuccessful patient outreach letter in mail requesting call back in writing  Will re-attempt [place] telephone outreach within 4 business days if I do not hear back from patient first  Oneta Rack, RN, BSN, [employer] [facility] Care Management  [phone]

## 2019-05-10 ENCOUNTER — Encounter: Payer: Self-pay | Admitting: *Deleted

## 2019-05-10 ENCOUNTER — Other Ambulatory Visit: Payer: Self-pay | Admitting: *Deleted

## 2019-05-10 NOTE — Patient Outreach (Signed)
[facility]) Care Management [place] Telephone Outreach, Routine insurance referral, new patient Unsuccessful (consecutive) second outreach attempt- new patient  05/10/2019  Lauren Higgins [birth year] [id number]  Unsuccessful (consecutive) second telephone outreach attempt to Lauren Higgins, 49 y/o female referred to [facility] RN CM by [facility] CMA on Monday May 02, 2019 for insurance referral/ [employer] HTN iniative.  Patient has history including, but not limited to, hypothyroidism; Graves Disease; HTN; and depression.  Patient has had no recent hospitalizations.  HIPAA compliant voice mail message left for patient, requesting return call back.  Plan:  Verified [facility] CM unsuccessful patient outreach letter placed in mail at time of initial unsuccessful outreach, requesting call back in writing  Will re-attempt [facility] CM telephone outreach within 4 business days if I do not hear back from patient first  Oneta Rack, RN, BSN, [employer] Coordinator [facility] Care Management  [phone]

## 2019-05-16 ENCOUNTER — Other Ambulatory Visit: Payer: Self-pay | Admitting: *Deleted

## 2019-05-16 ENCOUNTER — Encounter: Payer: Self-pay | Admitting: *Deleted

## 2019-05-16 NOTE — Patient Outreach (Signed)
[facility]) Care Management [facility] CM Telephone Outreach, routine insurance referral Unsuccessful (consecutive) third outreach attempt- new referral  05/16/2019  [name] [birth year] [id number]  Unsuccessful (consecutive) third telephone outreach attempt to Lauren Higgins, 49 y/o female referred to [place] CM by [facility] CMA on Monday May 02, 2019 for insurance referral/ [employer] HTN iniative.Patient has history including, but not limited to, hypothyroidism; Graves Disease; HTN; and depression. Patient has had no recent hospitalizations.  HIPAA compliant voice mail message left for patient, requesting return call back.  Plan:  Verified [facility] CM unsuccessful patient outreach letter placed in mail at time of initial unsuccessful outreach May 06, 2019, requesting call back in writing  Will move to [place] case closure if I do not hear back from patient by May 19, 2019 (10 business days of initial unsuccessful attempt)  Oneta Rack, RN, BSN, [employer] Coordinator [facility] Care Management  [phone]

## 2019-05-19 ENCOUNTER — Other Ambulatory Visit: Payer: Self-pay | Admitting: *Deleted

## 2019-05-19 NOTE — Patient Outreach (Signed)
[place] [facility]) Care Management [facility] CM Case Closure note/ documentation only Unable to establish contact with patient  05/19/2019  [name] 02-Jul-1970 [id number]  [facility] CM Case Closure note re:  Lauren Higgins, 50 y/o female referred to [facility] RN CM by [facility] CMA on Monday May 02, 2019 for insurance referral/ [employer] HTN iniative.Patient has history including, but not limited to, hypothyroidism; Graves Disease; HTN; and depression. Patient has had no recent hospitalizations.  Three consecutive unsuccessful outreach attempts have been placed to patient over course of 10 business days without patient response/ call back.  Plan:  Verified[facility] CM unsuccessful patient outreach letterplacedin mailon May 06, 2019,requesting call back in writing  [facility] as unable to establish contact with patient and make patient inactive with [facility] CM; will make patient's PCP aware of same  Oneta Rack, RN, BSN, [place] Coordinator [facility] Care Management  [phone]

## 2019-06-20 DIAGNOSIS — D485 Neoplasm of uncertain behavior of skin: Secondary | ICD-10-CM | POA: Diagnosis not present

## 2019-06-20 DIAGNOSIS — D229 Melanocytic nevi, unspecified: Secondary | ICD-10-CM | POA: Diagnosis not present

## 2019-06-20 DIAGNOSIS — Z85828 Personal history of other malignant neoplasm of skin: Secondary | ICD-10-CM | POA: Diagnosis not present

## 2019-06-20 DIAGNOSIS — Z872 Personal history of diseases of the skin and subcutaneous tissue: Secondary | ICD-10-CM | POA: Diagnosis not present

## 2019-06-20 DIAGNOSIS — L821 Other seborrheic keratosis: Secondary | ICD-10-CM | POA: Diagnosis not present

## 2019-06-20 DIAGNOSIS — Z86018 Personal history of other benign neoplasm: Secondary | ICD-10-CM | POA: Diagnosis not present

## 2019-06-20 DIAGNOSIS — L84 Corns and callosities: Secondary | ICD-10-CM | POA: Diagnosis not present

## 2019-06-20 DIAGNOSIS — L578 Other skin changes due to chronic exposure to nonionizing radiation: Secondary | ICD-10-CM | POA: Diagnosis not present

## 2019-06-24 ENCOUNTER — Other Ambulatory Visit (HOSPITAL_COMMUNITY)
Admission: RE | Admit: 2019-06-24 | Discharge: 2019-06-24 | Disposition: A | Discharge status: Home / Self Care | Payer: 59 | Source: Ambulatory Visit | Attending: Certified Nurse Midwife | Admitting: Certified Nurse Midwife

## 2019-06-24 ENCOUNTER — Other Ambulatory Visit: Payer: Self-pay

## 2019-06-24 ENCOUNTER — Ambulatory Visit (INDEPENDENT_AMBULATORY_CARE_PROVIDER_SITE_OTHER): Payer: 59 | Admitting: Certified Nurse Midwife

## 2019-06-24 VITALS — BP 118/72 | HR 97 | Resp 18 | Ht 69.0 in | Wt 235.4 lb

## 2019-06-24 DIAGNOSIS — Z1231 Encounter for screening mammogram for malignant neoplasm of breast: Secondary | ICD-10-CM | POA: Diagnosis not present

## 2019-06-24 DIAGNOSIS — N951 Menopausal and female climacteric states: Secondary | ICD-10-CM

## 2019-06-24 DIAGNOSIS — Z124 Encounter for screening for malignant neoplasm of cervix: Secondary | ICD-10-CM

## 2019-06-24 DIAGNOSIS — Z01419 Encounter for gynecological examination (general) (routine) without abnormal findings: Secondary | ICD-10-CM

## 2019-06-24 NOTE — Progress Notes (Signed)
Gynecology Annual Exam  PCP: [place], [place] Chief Complaint:  Chief Complaint  Patient presents with  . Gynecologic Exam    History of Present Illness: Lauren Higgins is a 49 y.o. MWF, G1P1, who presents for her annual gyn exam.  Had problems with heavy menses since 2012 and in 2019 she had an endometrial ablation. Since the ablation, her menses are every month and only last 1 days and are light flow/ spotting. She has noticed more hot flashes and is having problems with insomnia (early awakening). Has tried melatonin, Xanax, Advil, Tylenol without relief LMP: 1 week ago.  Denies dysmenorrhea. The patient is sexually active. She currently uses vasectomy  for contraception.    The patient's past medical history is notable for a history of hypothyroidism following radioactive ablation in November 1994 for Graves disease. She has exophthalmos and has had multiple eye surgeries for decompression since December 2015 at [facility]. This surgery caused some diplopia and she had other surgeries to try to correct this problem. The last eye surgery in February 2020 almost completely resolved her diplopia, but she has had some diplopia return recently when she gazes laterally or if she looks down.   Her past medical history is also significant for melanoma which was removed from behind her left thigh. She is followed by her dermatologist every 3-6 months. She just had an atypical nevus removed from her right foot. Her past medical history is also remarkable for obesity, hypertension, and depression. She has started seeing PA Celine Mans at the [facility]. She has lost about 40# over 2 years using Adipex and Metformin.   Her most recent Pap smear was obtained 04/02/2017 and was a NIL. Her most recent mammogram on 07/07/2018 and was [place] 1  There is a positive history of breast cancer in her paternal aunt and maternal grandmother. Genetic testing has not been done.  There is no family history of ovarian cancer. The patient does not do monthly self breast exams.  She had a Cologuard done 2020 that was negative. She does not smoke or drink alcohol. The patient exercises regularly. She does get adequate calcium in her diet and vitamin supplement. She had a recent cholesterol screening in 12/2016 that was borderline (238 TC, 174 tri, 52.3 HDL, 151 LDL).  Review of Systems: Review of Systems  Constitutional: Negative for chills, fever and weight loss.  HENT: Negative for sore throat.   Eyes: Positive for blurred vision (and eye irritation). Negative for double vision and pain (eye irritation).  Respiratory: Negative for hemoptysis, shortness of breath and wheezing.   Cardiovascular: Negative for chest pain, palpitations and leg swelling.  Gastrointestinal: Negative for abdominal pain, blood in stool, diarrhea, heartburn, nausea and vomiting.  Genitourinary: Negative for dysuria, frequency, hematuria and urgency.  Musculoskeletal: Positive for joint pain (and swelling). Negative for back pain and myalgias.  Skin: Positive for itching. Negative for rash.  Neurological: Positive for tingling. Negative for dizziness and headaches.  Endo/Heme/Allergies: Negative for environmental allergies and polydipsia. Does not bruise/bleed easily.       Positive for hot flashes and heat intolerance   Psychiatric/Behavioral: Negative for depression. The patient has insomnia. The patient is not nervous/anxious.     Past Medical History:  Past Medical History:  Diagnosis Date  . Depression   . Graves disease   . Hypertension   . Hypothyroidism    following iodine ablation  . Melanoma ([place])   . Menorrhagia   .  Obesity   . Thyroid eye disease     Past Surgical History:  Past Surgical History:  Procedure Laterality Date  . ENDOMETRIAL ABLATION  05/07/2017   Dr Kenton Kingfisher  . ENDOMETRIAL BIOPSY  2012   proliferative with focal breakdown  . EYE SURGERY     multiple eye  decompression surgeries from 2015 till present  . radioactive iodine ablation     Graves disease  . STRABISMUS SURGERY  03/04/2018    Obstetric History: G1P1  Family History:  Family History  Problem Relation Age of Onset  . Thyroid disease Father   . Hypertension Father   . Skin cancer Father   . Dementia Maternal Grandmother        dementia  . Breast cancer Maternal Grandmother 95  . Stroke Paternal Grandfather   . Glaucoma Mother   . Hypertension Mother   . Breast cancer Paternal Aunt 79  . Cancer Cousin 12    Social History:  Social History   Socioeconomic History  . Marital status: Married    Spouse name: Not on file  . Number of children: 1  . Years of education: Not on file  . Highest education level: Not on file  Occupational History  . Occupation: Education officer, museum  Tobacco Use  . Smoking status: Never Smoker  . Smokeless tobacco: Never Used  Vaping Use  . Vaping Use: Never used  Substance and Sexual Activity  . Alcohol use: No  . Drug use: No  . Sexual activity: Yes    Partners: Male    Birth control/protection: Condom, Surgical    Comment: Vasectomy  Other Topics Concern  . Not on file  Social History Narrative  . Not on file   Social Determinants of Health   Financial Resource Strain:   . Difficulty of Paying Living Expenses:   Food Insecurity:   . Worried About Charity fundraiser in the Last Year:   . Arboriculturist in the Last Year:   Transportation Needs:   . Film/video editor (Medical):   Marland Kitchen Lack of Transportation (Non-Medical):   Physical Activity:   . Days of Exercise per Week:   . Minutes of Exercise per Session:   Stress:   . Feeling of Stress :   Social Connections:   . Frequency of Communication with Friends and Family:   . Frequency of Social Gatherings with Friends and Family:   . Attends Religious Services:   . Active Member of Clubs or Organizations:   . Attends Archivist Meetings:   Marland Kitchen Marital Status:     Intimate Partner Violence:   . Fear of Current or Ex-Partner:   . Emotionally Abused:   Marland Kitchen Physically Abused:   . Sexually Abused:     Allergies:  Allergies  Allergen Reactions  . No Known Allergies     Medications:  Current Outpatient Medications on File Prior to Visit  Medication Sig Dispense Refill  . Cyanocobalamin (VITAMIN B-12) 5000 MCG SUBL Place under the tongue.    . hydrochlorothiazide (HYDRODIURIL) 25 MG tablet     . levOCARNitine (L-CARNITINE) 250 MG CAPS Take by mouth.    Marland Kitchen MAGNESIUM OXIDE 400 PO Take by mouth.    . metFORMIN (GLUCOPHAGE) 500 MG tablet Take by mouth.    . Multiple Vitamin (MULTIVITAMIN) capsule Take 1 capsule by mouth daily. Doterra vitamins    . NP THYROID 30 MG tablet     . phentermine (ADIPEX-P) 37.5 MG tablet     .  Probiotic Product (PROBIOTIC-10 PO) Take by mouth.     No current facility-administered medications on file prior to visit.        Physical Exam Vitals: BP 118/72   Pulse 97   Resp 18   Ht 5' 9"  (1.753 m)   Wt 235 lb 6.4 oz (106.8 kg)   SpO2 100%   BMI 34.76 kg/m  General: pleasant, WF, in NAD HEENT: normocephalic, anicteric.  Thyroid: no enlargement, no palpable nodules Pulmonary: No increased work of breathing, CTAB Cardiovascular: RRR without murmur Breast: Breast symmetrical, no tenderness, no palpable nodules or masses, no skin or nipple retraction present, no nipple discharge.  No axillary, infraclavicular, or supraclavicular lymphadenopathy. Abdomen:obese, soft, non-tender, non-distended.  Umbilicus without lesions.  No hepatomegaly,  or masses palpable. No evidence of hernia  Genitourinary:  External: Normal external female genitalia.  Normal urethral meatus, normal Bartholin's and Skene's glands.    Vagina: Normal vaginal mucosa, no evidence of prolapse.    Cervix: Grossly normal in appearance, no bleeding  Uterus: Non-enlarged, mobile, normal contour. anteverted   Adnexa: ovaries non-enlarged, no adnexal  masses  Rectal: deferred  Lymphatic: no evidence of inguinal lymphadenopathy Extremities: no edema, erythema, or tenderness Neurologic: Grossly intact Psychiatric: mood appropriate, affect full    Assessment: 49 y.o. G1P1 well woman exam Menorrhagia-resolved with ablation Vasomotor symptoms/ insomnia with early wakening.   R/O sleep disorder  Possible perimenopausal sleep disturbance.  Plan:   1) Breast cancer screening: Mammogram at [facility] due after 07 July 2019  2) Pap smear done. Would like to do Pap smears every 3 years after this one  3) Osteoporosis prevention- Continue exercise program. Calcium and vitamin D3 requirements discussed  4) Routine healthcare maintenance including cholesterol, diabetes screening per PCP. Will get FSH and LH. Consider HT vs sleep study pending results   5) Colon cancer screening: Cologuard done last year. Next one due in 2023  6) RTO 1 year and prn.  Dalia Heading, CNM

## 2019-06-25 LAB — FSH/LH
FSH: 22.9 m[IU]/mL
LH: 25.7 m[IU]/mL

## 2019-06-27 LAB — CYTOLOGY - PAP
Comment: NEGATIVE
Diagnosis: NEGATIVE
High risk HPV: NEGATIVE

## 2019-07-04 ENCOUNTER — Encounter: Payer: Self-pay | Admitting: Certified Nurse Midwife

## 2019-07-05 NOTE — Telephone Encounter (Signed)
Called patient regarding [place] and LH results (both in the 20s) as well as Pap smear (NIL/negative HRHPV). Advised is probably perimenopausal. Having some issues with insomnia and vasomotor symptoms. Offered HT in the form of estrogen and/ or Prometrium. Discussed pros and cons of HT. Patient attributes her symptoms to her thyroid issues. Wishes to talk with endocrinologist regarding her symptoms. Will get back to me if she desires further discussion on options for treatment of these symptoms. Dalia Heading, CNM

## 2019-07-08 ENCOUNTER — Ambulatory Visit
Admission: RE | Admit: 2019-07-08 | Discharge: 2019-07-08 | Disposition: A | Discharge status: Home / Self Care | Payer: 59 | Source: Ambulatory Visit | Attending: Certified Nurse Midwife | Admitting: Certified Nurse Midwife

## 2019-07-08 DIAGNOSIS — Z1231 Encounter for screening mammogram for malignant neoplasm of breast: Secondary | ICD-10-CM | POA: Diagnosis not present

## 2019-07-13 DIAGNOSIS — D508 Other iron deficiency anemias: Secondary | ICD-10-CM | POA: Diagnosis not present

## 2019-07-13 DIAGNOSIS — R5383 Other fatigue: Secondary | ICD-10-CM | POA: Diagnosis not present

## 2019-07-13 DIAGNOSIS — E559 Vitamin D deficiency, unspecified: Secondary | ICD-10-CM | POA: Diagnosis not present

## 2019-07-13 DIAGNOSIS — R946 Abnormal results of thyroid function studies: Secondary | ICD-10-CM | POA: Diagnosis not present

## 2019-07-13 DIAGNOSIS — E063 Autoimmune thyroiditis: Secondary | ICD-10-CM | POA: Diagnosis not present

## 2019-07-13 DIAGNOSIS — E538 Deficiency of other specified B group vitamins: Secondary | ICD-10-CM | POA: Diagnosis not present

## 2019-07-26 DIAGNOSIS — I10 Essential (primary) hypertension: Secondary | ICD-10-CM | POA: Diagnosis not present

## 2019-07-26 DIAGNOSIS — E063 Autoimmune thyroiditis: Secondary | ICD-10-CM | POA: Diagnosis not present

## 2019-07-26 DIAGNOSIS — Z6836 Body mass index (BMI) 36.0-36.9, adult: Secondary | ICD-10-CM | POA: Diagnosis not present

## 2019-07-26 DIAGNOSIS — R5383 Other fatigue: Secondary | ICD-10-CM | POA: Diagnosis not present

## 2019-07-26 DIAGNOSIS — E559 Vitamin D deficiency, unspecified: Secondary | ICD-10-CM | POA: Diagnosis not present

## 2019-07-26 DIAGNOSIS — E668 Other obesity: Secondary | ICD-10-CM | POA: Diagnosis not present

## 2019-07-26 DIAGNOSIS — E538 Deficiency of other specified B group vitamins: Secondary | ICD-10-CM | POA: Diagnosis not present

## 2019-07-26 DIAGNOSIS — R7982 Elevated C-reactive protein (CRP): Secondary | ICD-10-CM | POA: Diagnosis not present

## 2019-07-26 DIAGNOSIS — R69 Illness, unspecified: Secondary | ICD-10-CM | POA: Diagnosis not present

## 2019-07-26 DIAGNOSIS — H0589 Other disorders of orbit: Secondary | ICD-10-CM | POA: Diagnosis not present

## 2019-09-29 DIAGNOSIS — E0500 Thyrotoxicosis with diffuse goiter without thyrotoxic crisis or storm: Secondary | ICD-10-CM | POA: Diagnosis not present

## 2019-11-02 DIAGNOSIS — E538 Deficiency of other specified B group vitamins: Secondary | ICD-10-CM | POA: Diagnosis not present

## 2019-11-02 DIAGNOSIS — E063 Autoimmune thyroiditis: Secondary | ICD-10-CM | POA: Diagnosis not present

## 2019-11-02 DIAGNOSIS — R946 Abnormal results of thyroid function studies: Secondary | ICD-10-CM | POA: Diagnosis not present

## 2019-11-02 DIAGNOSIS — E668 Other obesity: Secondary | ICD-10-CM | POA: Diagnosis not present

## 2019-11-02 DIAGNOSIS — E559 Vitamin D deficiency, unspecified: Secondary | ICD-10-CM | POA: Diagnosis not present

## 2019-11-02 DIAGNOSIS — R5383 Other fatigue: Secondary | ICD-10-CM | POA: Diagnosis not present

## 2019-11-15 DIAGNOSIS — R946 Abnormal results of thyroid function studies: Secondary | ICD-10-CM | POA: Diagnosis not present

## 2019-11-15 DIAGNOSIS — R5383 Other fatigue: Secondary | ICD-10-CM | POA: Diagnosis not present

## 2019-11-15 DIAGNOSIS — R69 Illness, unspecified: Secondary | ICD-10-CM | POA: Diagnosis not present

## 2019-11-15 DIAGNOSIS — E668 Other obesity: Secondary | ICD-10-CM | POA: Diagnosis not present

## 2019-11-15 DIAGNOSIS — R7982 Elevated C-reactive protein (CRP): Secondary | ICD-10-CM | POA: Diagnosis not present

## 2019-11-15 DIAGNOSIS — I10 Essential (primary) hypertension: Secondary | ICD-10-CM | POA: Diagnosis not present

## 2019-11-15 DIAGNOSIS — E538 Deficiency of other specified B group vitamins: Secondary | ICD-10-CM | POA: Diagnosis not present

## 2019-11-15 DIAGNOSIS — Z6836 Body mass index (BMI) 36.0-36.9, adult: Secondary | ICD-10-CM | POA: Diagnosis not present

## 2019-11-15 DIAGNOSIS — E559 Vitamin D deficiency, unspecified: Secondary | ICD-10-CM | POA: Diagnosis not present

## 2019-11-15 DIAGNOSIS — H0589 Other disorders of orbit: Secondary | ICD-10-CM | POA: Diagnosis not present

## 2019-11-15 DIAGNOSIS — E063 Autoimmune thyroiditis: Secondary | ICD-10-CM | POA: Diagnosis not present

## 2019-12-22 DIAGNOSIS — Z85828 Personal history of other malignant neoplasm of skin: Secondary | ICD-10-CM | POA: Diagnosis not present

## 2019-12-22 DIAGNOSIS — Z86018 Personal history of other benign neoplasm: Secondary | ICD-10-CM | POA: Diagnosis not present

## 2019-12-22 DIAGNOSIS — Z85820 Personal history of malignant melanoma of skin: Secondary | ICD-10-CM | POA: Diagnosis not present

## 2019-12-22 DIAGNOSIS — Z872 Personal history of diseases of the skin and subcutaneous tissue: Secondary | ICD-10-CM | POA: Diagnosis not present

## 2019-12-22 DIAGNOSIS — L578 Other skin changes due to chronic exposure to nonionizing radiation: Secondary | ICD-10-CM | POA: Diagnosis not present

## 2020-01-03 DIAGNOSIS — M545 Low back pain, unspecified: Secondary | ICD-10-CM | POA: Diagnosis not present

## 2020-07-04 ENCOUNTER — Ambulatory Visit: Payer: 59 | Admitting: Obstetrics & Gynecology

## 2020-07-05 ENCOUNTER — Ambulatory Visit (INDEPENDENT_AMBULATORY_CARE_PROVIDER_SITE_OTHER): Payer: Managed Care, Other (non HMO) | Admitting: Obstetrics

## 2020-07-05 ENCOUNTER — Encounter: Payer: Self-pay | Admitting: Obstetrics

## 2020-07-05 ENCOUNTER — Other Ambulatory Visit: Payer: Self-pay

## 2020-07-05 VITALS — BP 116/70 | Ht 69.0 in | Wt 235.4 lb

## 2020-07-05 DIAGNOSIS — Z1231 Encounter for screening mammogram for malignant neoplasm of breast: Secondary | ICD-10-CM | POA: Diagnosis not present

## 2020-07-05 DIAGNOSIS — Z01419 Encounter for gynecological examination (general) (routine) without abnormal findings: Secondary | ICD-10-CM

## 2020-07-05 NOTE — Progress Notes (Signed)
Gynecology Annual Exam  PCP: Dalia Heading, CNM (Inactive)  Chief Complaint:  Chief Complaint  Patient presents with   Gynecologic Exam    History of Present Illness: Patient is a 50 y.o. G1P1001 presents for annual exam. The patient has no complaints today. She has a hx of ablation. Describes occasional hot flashes, but nothing bothersome enough to request treatment. 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  LMP: No LMP recorded. Patient has had an ablation. Average Interval: irregular, not applicable days Duration of flow: 2 days Heavy Menses: no Clots: no Intermenstrual Bleeding: no Postcoital Bleeding: no Dysmenorrhea: no   The patient is sexually active. She currently uses vasectomy for contraception. She denies dyspareunia.  The patient does not perform self breast exams.  There is no notable family history of breast or ovarian cancer in her family.  The patient wears seatbelts: yes.   The patient has regular exercise: yes.    The patient denies current symptoms of depression.    Review of Systems: Review of Systems  Constitutional: Negative.   HENT: Negative.    Eyes: Negative.   Respiratory: Negative.    Cardiovascular: Negative.   Gastrointestinal: Negative.   Genitourinary: Negative.   Musculoskeletal: Negative.   Skin: Negative.   Neurological: Negative.   Endo/Heme/Allergies: Negative.   Psychiatric/Behavioral: Negative.     Past Medical History:  Patient Active Problem List   Diagnosis Date Noted   Mechanical lagophthalmos, bilateral, upper and lower eyelids 11/03/2018   Hypertropia of right eye 11/20/2017    Formatting of this note might be different from the original. 11/20/2017 RHT fairly comitant in lateral gazes, minimal in DG CT sinus reviewed. LIR muscle much larger than RIR. Recommend asymmetric IR recession OS>OD Could also consider RSR recession FDT  01/20/2018 Plan for LIR  recess (prev recessed 64m) Mersilene Adjustable  Last Assessment & Plan:  Formatting of this note might be different from the original. 11/20/2017 RHT fairly comitant in lateral gazes, minimal in DG CT sinus reviewed. LIR muscle much larger than RIR. Recommend asymmetric IR recession OS>OD Could also consider RSR recession FDT  01/20/2018 Exyclo 2-3 deg OS Plan for LIR recess (prev recessed 445m Mersilene Adjustable     Pure hypercholesterolemia 12/31/2016   Anomalous head position 08/18/2016    Last Assessment & Plan:  Formatting of this note might be different from the original. Chin up     Visual confusion 08/18/2016   Hypertriglyceridemia, essential 06/20/2016   Menorrhagia 04/03/2016   Thyroid eye disease 04/03/2016   Graves disease 04/03/2016    Had radioactive ablation of thyroid     Vaccine counseling 12/25/2015   Frozen shoulder syndrome 10/09/2015    Injected 10/09/2015 Repeat injection given 12/10/2015.     Other specified postprocedural states 06/01/2014    Formatting of this note might be different from the original. 03/18/2018 Looks very good No diplopia in PP and downgaze She tolerates some prism in primary position so there is room to correct her right hypertropia in side gazes Patient would like to get new glasses but right now is too soon Will have her return in 4-6 weeks and re-refract at that time  Last Assessment & Plan:  Formatting of this note might be different from the original. 03/18/2018 Looks very good No diplopia in PP and downgaze She tolerates some prism in primary position so there is room to correct her right hypertropia in side gazes Patient  would like to get new glasses but right now is too soon Will have her return in 4-6 weeks and re-refract at that time     Monocular esotropia of right eye 02/08/2014    Formatting of this note might be different from the original. 11/20/2017 ET fairly comitant Recommend BMR recess vs.  BLR resect, Forced duction testing CT sinus reviewed - significant asymmetry MR much larger than LR  01/20/2018 Target ET 18-20  FDT. MR already recessed 5-5.34m. May need LR resect    Last Assessment & Plan:  Formatting of this note might be different from the original. 11/20/2017 ET fairly comitant Recommend BMR recess vs. BLR resect, Forced duction testing CT sinus reviewed - significant asymmetry MR much larger than LR  01/20/2018 Target ET 18-20  FDT. MR already recessed 5-5.552m May need LR resect     Diplopia 02/01/2014    Formatting of this note might be different from the original. 05/05/2018 Resolved   11/03/2018 Intermittent  Only present in downgaze when she is tired  Small angle intermittent RHT 1 PD .  2 PD intermittent ET in RG  Recommend to observe   Last Assessment & Plan:  Formatting of this note might be different from the original. 11/03/2018 Intermittent  Only present in downgaze when she is tired  Small angle intermittent RHT 1 PD .  2 PD intermittent ET in RG  Recommend to observe     Essential hypertension with goal blood pressure less than 140/90 11/23/2013   Non morbid obesity due to excess calories 11/23/2013   Pre-diabetes 11/23/2013   Vitamin D deficiency 02/15/2013    Vit d level was 21 in feb     Bursitis of knee 02/06/2013   Visit for preventive health examination 02/04/2013   Obesity (BMI 30.0-34.9) 02/01/2012    Prescribed appetite suppressant by dr. SoGabriel Carina     Hypothyroidism (acquired) 02/01/2012    Secondary to radioactive iodine treatment for hyperthyroidism.      Past Surgical History:  Past Surgical History:  Procedure Laterality Date   ENDOMETRIAL ABLATION  05/07/2017   Dr HaKenton Kingfisher ENDOMETRIAL BIOPSY  2012   proliferative with focal breakdown   EYE SURGERY     multiple eye decompression surgeries from 2015 till present   radioactive iodine ablation     Graves disease   STRABISMUS SURGERY  03/04/2018     Gynecologic History:  No LMP recorded. Patient has had an ablation. Contraception: vasectomy Last Pap: Results were: NILM no abnormalities  Last mammogram: 2021 Results were: BI-RAD I  Obstetric History: G1P1001  Family History:  Family History  Problem Relation Age of Onset   Thyroid disease Father    Hypertension Father    Skin cancer Father    Dementia Maternal Grandmother        dementia   Breast cancer Maternal Grandmother [phone] Stroke Paternal Grandfather    Glaucoma Mother    Hypertension Mother    Breast cancer Paternal Aunt [phone] Cancer Cousin 12    Social History:  Social History   Socioeconomic History   Marital status: Married    Spouse name: Not on file   Number of children: 1   Years of education: Not on file   Highest education level: Not on file  Occupational History   Occupation: soEducation officer, museumTobacco Use   Smoking status: Never   Smokeless tobacco: Never  Vaping Use   Vaping Use: Never used  Substance  and Sexual Activity   Alcohol use: No   Drug use: No   Sexual activity: Yes    Partners: Male    Birth control/protection: Condom, Surgical    Comment: Vasectomy  Other Topics Concern   Not on file  Social History Narrative   Not on file   Social Determinants of Health   Financial Resource Strain: Not on file  Food Insecurity: Not on file  Transportation Needs: Not on file  Physical Activity: Not on file  Stress: Not on file  Social Connections: Not on file  Intimate Partner Violence: Not on file    Allergies:  Allergies  Allergen Reactions   No Known Allergies     Medications: Prior to Admission medications   Medication Sig Start Date End Date Taking? Authorizing Provider  Cyanocobalamin (VITAMIN B-12) 5000 MCG SUBL Place under the tongue.   Yes [provider]  hydrochlorothiazide (HYDRODIURIL) 25 MG tablet  04/01/17  Yes [provider]  levOCARNitine (L-CARNITINE) 250 MG CAPS Take by mouth.   Yes  [provider]  MAGNESIUM OXIDE 400 PO Take by mouth.   Yes [provider]  metFORMIN (GLUCOPHAGE) 500 MG tablet Take by mouth.   Yes [provider]  Multiple Vitamin (MULTIVITAMIN) capsule Take 1 capsule by mouth daily. Doterra vitamins   Yes [provider]  NP THYROID 30 MG tablet  03/30/17  Yes [provider]  phentermine (ADIPEX-P) 37.5 MG tablet  03/17/13  Yes [provider]  Probiotic Product (PROBIOTIC-10 PO) Take by mouth.   Yes [provider]    Physical Exam Vitals: Blood pressure 116/70, height 5' 9"  (1.753 m), weight 235 lb 6.4 oz (106.8 kg).  General: NAD HEENT: normocephalic, anicteric Thyroid: no enlargement, no palpable nodules Pulmonary: No increased work of breathing, CTAB Cardiovascular: RRR, distal pulses 2+ Breast: Breast symmetrical, no tenderness, no palpable nodules or masses, no skin or nipple retraction present, no nipple discharge.  Some fibrocystic changes noted.No axillary or supraclavicular lymphadenopathy. Abdomen: NABS, soft, non-tender, non-distended.  Umbilicus without lesions.  No hepatomegaly, splenomegaly or masses palpable. No evidence of hernia  Genitourinary:  External: Normal external female genitalia.  Normal urethral meatus, normal Bartholin's and Skene's glands.    Vagina: Normal vaginal mucosa, no evidence of prolapse.    Cervix: Grossly normal in appearance, no bleeding  Uterus: anteverted, Non-enlarged, mobile, normal contour.  No CMT  Adnexa: ovaries non-enlarged, no adnexal masses  Rectal: deferred  Lymphatic: no evidence of inguinal lymphadenopathy Extremities: no edema, erythema, or tenderness Neurologic: Grossly intact Psychiatric: mood appropriate, affect full  Female chaperone present for pelvic and breast  portions of the physical exam    Assessment: 50 y.o. G1P1001 routine annual exam  Plan: Problem List Items Addressed This Visit   None Visit Diagnoses      Encounter for screening mammogram for malignant neoplasm of breast    -  Primary   Relevant Orders   MM DIGITAL SCREENING BILATERAL   Women's annual routine gynecological examination           1) Mammogram - recommend yearly screening mammogram.  Mammogram Was ordered today   2) STI screening  wasoffered and declined  3) ASCCP guidelines and rational discussed.  Patient opts for every 3 years screening interval  4) Contraception - the patient is currently using  vasectomy.  She is happy with her current form of contraception and plans to continue  5) Colonoscopy -- Screening recommended starting at age 50 for  average risk individuals, age 35 for individuals deemed at increased risk (including African Americans) and recommended to continue until age 32.  For patient age 59-85 individualized approach is recommended.  Gold standard screening is via colonoscopy, Cologuard screening is an acceptable alternative for patient unwilling or unable to undergo colonoscopy.  "Colorectal cancer screening for average?risk adults: 2018 guideline update from the [place]: A Cancer Journal for Clinicians: Jun 04, 2016   6) Routine healthcare maintenance including cholesterol, diabetes screening discussed managed by PCP  7) Return in about 1 year (around 07/05/2021) for annual.  Imagene Riches, CNM  07/05/2020 6:09 PM   [facility] 07/05/2020, 6:06 PM

## 2020-07-06 ENCOUNTER — Other Ambulatory Visit: Payer: Self-pay | Admitting: Obstetrics

## 2020-07-06 DIAGNOSIS — Z1231 Encounter for screening mammogram for malignant neoplasm of breast: Secondary | ICD-10-CM

## 2020-07-10 ENCOUNTER — Ambulatory Visit
Admission: RE | Admit: 2020-07-10 | Discharge: 2020-07-10 | Disposition: A | Discharge status: Home / Self Care | Payer: Managed Care, Other (non HMO) | Source: Ambulatory Visit | Attending: Obstetrics | Admitting: Obstetrics

## 2020-07-10 ENCOUNTER — Other Ambulatory Visit: Payer: Self-pay

## 2020-07-10 DIAGNOSIS — Z1231 Encounter for screening mammogram for malignant neoplasm of breast: Secondary | ICD-10-CM | POA: Diagnosis not present

## 2020-10-24 ENCOUNTER — Other Ambulatory Visit: Payer: Self-pay

## 2020-10-24 ENCOUNTER — Other Ambulatory Visit: Payer: Self-pay | Admitting: Orthopedic Surgery

## 2020-10-24 ENCOUNTER — Encounter
Admission: RE | Admit: 2020-10-24 | Discharge: 2020-10-24 | Disposition: A | Discharge status: Home / Self Care | Payer: 59 | Source: Ambulatory Visit | Attending: Orthopedic Surgery | Admitting: Orthopedic Surgery

## 2020-10-24 HISTORY — DX: Other specified postprocedural states: R11.2

## 2020-10-24 HISTORY — DX: Prediabetes: R73.03

## 2020-10-24 HISTORY — DX: Other specified postprocedural states: Z98.890

## 2020-10-24 NOTE — Patient Instructions (Signed)
Your procedure is scheduled on: 11/01/20 Report to [facility]. To find out your arrival time please call [phone] between 1PM - 3PM on 10/31/20.  Remember: Instructions that are not followed completely may result in serious medical risk, up to and including death, or upon the discretion of your surgeon and anesthesiologist your surgery may need to be rescheduled.     _X__ 1. Do not eat food or drink any liquids after midnight the night before your procedure.                 No gum chewing or hard candies.   __X__2.  On the morning of surgery brush your teeth with toothpaste and water, you                 may rinse your mouth with mouthwash if you wish.  Do not swallow any              toothpaste of mouthwash.     _X__ 3.  No Alcohol for 24 hours before or after surgery.   _X__ 4.  Do Not Smoke or use e-cigarettes For 24 Hours Prior to Your Surgery.                 Do not use any chewable tobacco products for at least 6 hours prior to                 surgery.  ____  5.  Bring all medications with you on the day of surgery if instructed.   __X__  6.  Notify your doctor if there is any change in your medical condition      (cold, fever, infections).     Do not wear jewelry, make-up, hairpins, clips or nail polish. Do not wear lotions, powders, or perfumes.  Do not shave 48 hours prior to surgery. Men may shave face and neck. Do not bring valuables to the hospital.    [facility] is not responsible for any belongings or valuables.  Contacts, dentures/partials or body piercings may not be worn into surgery. Bring a case for your contacts, glasses or hearing aids, a denture cup will be supplied. Leave your suitcase in the car. After surgery it may be brought to your room. For patients admitted to the hospital, discharge time is determined by your treatment team.   Patients discharged the day of surgery will not be allowed to  drive home.   Please read over the following fact sheets that you were given:     __X__ Take these medicines the morning of surgery with A SIP OF WATER:    1. NP THYROID 60 MG tablet  2.   3.   4.  5.  6.  ____ Fleet Enema (as directed)   __X__ Use CHG Soap/SAGE wipes as directed  ____ Use inhalers on the day of surgery  __X__ Stop metformin/Janumet/Farxiga 2 days prior to surgery  Last dose morning 10/15   ____ Take 1/2 of usual insulin dose the night before surgery. No insulin the morning          of surgery.   ____ Stop Blood Thinners Coumadin/Plavix/Xarelto/Pleta/Pradaxa/Eliquis/Effient/Aspirin  on   Or contact your Surgeon, Cardiologist or Medical Doctor regarding  ability to stop your blood thinners  __X__ Stop Anti-inflammatories 7 days before surgery such as Advil, Ibuprofen, Motrin,  BC or Goodies Powder, Naprosyn, Naproxen, Aleve, Aspirin    __X__ Stop all  supplements, fish oil or vitamin E until after surgery.  Start 10/25/20  ____ Bring C-Pap to the hospital.

## 2020-10-25 ENCOUNTER — Encounter
Admission: RE | Admit: 2020-10-25 | Discharge: 2020-10-25 | Disposition: A | Discharge status: Home / Self Care | Payer: 59 | Source: Ambulatory Visit | Attending: Orthopedic Surgery | Admitting: Orthopedic Surgery

## 2020-10-25 ENCOUNTER — Encounter: Payer: Self-pay | Admitting: Urgent Care

## 2020-10-25 DIAGNOSIS — Z01810 Encounter for preprocedural cardiovascular examination: Secondary | ICD-10-CM | POA: Insufficient documentation

## 2020-10-31 MED ORDER — ORAL CARE MOUTH RINSE: 15.0000 mL | Freq: Once | OROMUCOSAL | Status: AC

## 2020-10-31 MED ORDER — CHLORHEXIDINE GLUCONATE CLOTH 2 % EX PADS
6.0000 | MEDICATED_PAD | Freq: Once | CUTANEOUS | Status: AC
  Administered 2020-11-01: 6 via TOPICAL

## 2020-10-31 MED ORDER — APREPITANT 40 MG PO CAPS: 40.0000 mg | ORAL_CAPSULE | Freq: Once | ORAL | Status: AC

## 2020-10-31 MED ORDER — LACTATED RINGERS IV SOLN
INTRAVENOUS | Status: DC
Start: 2020-10-31 — End: 2020-11-01

## 2020-10-31 MED ORDER — CEFAZOLIN SODIUM-DEXTROSE 2-4 GM/100ML-% IV SOLN
2.0000 g | INTRAVENOUS | Status: AC
  Administered 2020-11-01: 2 g via INTRAVENOUS

## 2020-10-31 MED ORDER — ACETAMINOPHEN 500 MG PO TABS: 1000.0000 mg | ORAL_TABLET | ORAL | Status: AC

## 2020-10-31 MED ORDER — FAMOTIDINE 20 MG PO TABS: 20.0000 mg | ORAL_TABLET | Freq: Once | ORAL | Status: AC

## 2020-10-31 MED ORDER — CHLORHEXIDINE GLUCONATE 0.12 % MT SOLN: 15.0000 mL | Freq: Once | OROMUCOSAL | Status: AC

## 2020-11-01 ENCOUNTER — Ambulatory Visit: Payer: 59 | Admitting: Urgent Care

## 2020-11-01 ENCOUNTER — Encounter: Payer: Self-pay | Admitting: Orthopedic Surgery

## 2020-11-01 ENCOUNTER — Ambulatory Visit
Admission: RE | Admit: 2020-11-01 | Discharge: 2020-11-01 | Disposition: A | Discharge status: Home / Self Care | Payer: 59 | Attending: Orthopedic Surgery | Admitting: Orthopedic Surgery

## 2020-11-01 ENCOUNTER — Encounter
Admission: RE | Disposition: A | Discharge status: Home / Self Care | Payer: Self-pay | Source: Home / Self Care | Attending: Orthopedic Surgery

## 2020-11-01 ENCOUNTER — Other Ambulatory Visit: Payer: Self-pay

## 2020-11-01 DIAGNOSIS — G5601 Carpal tunnel syndrome, right upper limb: Secondary | ICD-10-CM | POA: Diagnosis not present

## 2020-11-01 HISTORY — PX: CARPAL TUNNEL RELEASE: SHX101

## 2020-11-01 LAB — POCT PREGNANCY, URINE: Preg Test, Ur: NEGATIVE

## 2020-11-01 SURGERY — CARPAL TUNNEL RELEASE
Anesthesia: General | Site: Wrist | Laterality: Right

## 2020-11-01 MED ORDER — PROMETHAZINE HCL 25 MG/ML IJ SOLN
6.2500 mg | INTRAMUSCULAR | Status: DC | PRN
Start: 2020-11-01 — End: 2020-11-01

## 2020-11-01 MED ORDER — DEXMEDETOMIDINE (PRECEDEX) IN NS 20 MCG/5ML (4 MCG/ML) IV SYRINGE
PREFILLED_SYRINGE | INTRAVENOUS | Status: DC | PRN
  Administered 2020-11-01: 8 ug via INTRAVENOUS
  Administered 2020-11-01: 4 ug via INTRAVENOUS
  Administered 2020-11-01: 8 ug via INTRAVENOUS

## 2020-11-01 MED ORDER — OXYCODONE HCL 5 MG PO TABS
5.0000 mg | ORAL_TABLET | Freq: Once | ORAL | Status: AC | PRN
Start: 2020-11-01 — End: 2020-11-01

## 2020-11-01 MED ORDER — LIDOCAINE HCL (CARDIAC) PF 100 MG/5ML IV SOSY
PREFILLED_SYRINGE | INTRAVENOUS | Status: DC | PRN
  Administered 2020-11-01: 60 mg via INTRAVENOUS

## 2020-11-01 MED ORDER — OXYCODONE HCL 5 MG PO TABS
ORAL_TABLET | ORAL | Status: AC
  Administered 2020-11-01: 5 mg via ORAL
  Filled 2020-11-01: qty 1

## 2020-11-01 MED ORDER — BUPIVACAINE HCL (PF) 0.5 % IJ SOLN
INTRAMUSCULAR | Status: AC
  Filled 2020-11-01: qty 30

## 2020-11-01 MED ORDER — FENTANYL CITRATE (PF) 100 MCG/2ML IJ SOLN
INTRAMUSCULAR | Status: AC
  Administered 2020-11-01: 25 ug via INTRAVENOUS
  Filled 2020-11-01: qty 2

## 2020-11-01 MED ORDER — ONDANSETRON HCL 4 MG/2ML IJ SOLN
INTRAMUSCULAR | Status: DC | PRN
  Administered 2020-11-01: 4 mg via INTRAVENOUS

## 2020-11-01 MED ORDER — DIPHENHYDRAMINE HCL 50 MG/ML IJ SOLN
INTRAMUSCULAR | Status: AC
  Filled 2020-11-01: qty 1

## 2020-11-01 MED ORDER — LIDOCAINE HCL (PF) 2 % IJ SOLN
INTRAMUSCULAR | Status: AC
  Filled 2020-11-01: qty 5

## 2020-11-01 MED ORDER — PROPOFOL 10 MG/ML IV BOLUS
INTRAVENOUS | Status: AC
  Filled 2020-11-01: qty 20

## 2020-11-01 MED ORDER — NEOMYCIN-POLYMYXIN B GU 40-200000 IR SOLN
Status: AC
  Filled 2020-11-01: qty 2

## 2020-11-01 MED ORDER — 0.9 % SODIUM CHLORIDE (POUR BTL) OPTIME
TOPICAL | Status: DC | PRN
  Administered 2020-11-01: 500 mL

## 2020-11-01 MED ORDER — ONDANSETRON HCL 4 MG PO TABS: 4.0000 mg | ORAL_TABLET | Freq: Three times a day (TID) | ORAL | 0 refills | Status: DC | PRN

## 2020-11-01 MED ORDER — CHLORHEXIDINE GLUCONATE 0.12 % MT SOLN
OROMUCOSAL | Status: AC
  Administered 2020-11-01: 15 mL via OROMUCOSAL
  Filled 2020-11-01: qty 15

## 2020-11-01 MED ORDER — NEOMYCIN-POLYMYXIN B GU 40-200000 IR SOLN
Status: DC | PRN
  Administered 2020-11-01: 2 mL

## 2020-11-01 MED ORDER — OXYCODONE HCL 5 MG PO TABS: 5.0000 mg | ORAL_TABLET | ORAL | 0 refills | Status: DC | PRN

## 2020-11-01 MED ORDER — MIDAZOLAM HCL 2 MG/2ML IJ SOLN
INTRAMUSCULAR | Status: DC | PRN
  Administered 2020-11-01: 2 mg via INTRAVENOUS

## 2020-11-01 MED ORDER — ACETAMINOPHEN 10 MG/ML IV SOLN: 1000.0000 mg | Freq: Once | INTRAVENOUS | Status: DC | PRN

## 2020-11-01 MED ORDER — ONDANSETRON HCL 4 MG/2ML IJ SOLN
INTRAMUSCULAR | Status: AC
  Filled 2020-11-01: qty 2

## 2020-11-01 MED ORDER — FENTANYL CITRATE (PF) 100 MCG/2ML IJ SOLN
INTRAMUSCULAR | Status: AC
  Filled 2020-11-01: qty 2

## 2020-11-01 MED ORDER — FAMOTIDINE 20 MG PO TABS
ORAL_TABLET | ORAL | Status: AC
  Administered 2020-11-01: 20 mg via ORAL
  Filled 2020-11-01: qty 1

## 2020-11-01 MED ORDER — ACETAMINOPHEN 500 MG PO TABS
ORAL_TABLET | ORAL | Status: AC
  Administered 2020-11-01: 1000 mg via ORAL
  Filled 2020-11-01: qty 2

## 2020-11-01 MED ORDER — FENTANYL CITRATE (PF) 100 MCG/2ML IJ SOLN
25.0000 ug | INTRAMUSCULAR | Status: DC | PRN
  Administered 2020-11-01 (×2): 25 ug via INTRAVENOUS

## 2020-11-01 MED ORDER — DEXMEDETOMIDINE (PRECEDEX) IN NS 20 MCG/5ML (4 MCG/ML) IV SYRINGE
PREFILLED_SYRINGE | INTRAVENOUS | Status: AC
  Filled 2020-11-01: qty 5

## 2020-11-01 MED ORDER — MIDAZOLAM HCL 2 MG/2ML IJ SOLN
INTRAMUSCULAR | Status: AC
  Filled 2020-11-01: qty 2

## 2020-11-01 MED ORDER — CEFAZOLIN SODIUM-DEXTROSE 2-4 GM/100ML-% IV SOLN
INTRAVENOUS | Status: AC
  Filled 2020-11-01: qty 100

## 2020-11-01 MED ORDER — KETAMINE HCL 50 MG/5ML IJ SOSY
PREFILLED_SYRINGE | INTRAMUSCULAR | Status: AC
  Filled 2020-11-01: qty 5

## 2020-11-01 MED ORDER — PROPOFOL 10 MG/ML IV BOLUS
INTRAVENOUS | Status: DC | PRN
  Administered 2020-11-01: 40 mg via INTRAVENOUS
  Administered 2020-11-01: 50 ug via INTRAVENOUS
  Administered 2020-11-01: 150 mg via INTRAVENOUS
  Administered 2020-11-01: 125 ug/kg/min via INTRAVENOUS
  Administered 2020-11-01: 50 mg via INTRAVENOUS

## 2020-11-01 MED ORDER — SUGAMMADEX SODIUM 500 MG/5ML IV SOLN
INTRAVENOUS | Status: AC
  Filled 2020-11-01: qty 5

## 2020-11-01 MED ORDER — PROPOFOL 1000 MG/100ML IV EMUL
INTRAVENOUS | Status: AC
  Filled 2020-11-01: qty 100

## 2020-11-01 MED ORDER — APREPITANT 40 MG PO CAPS
ORAL_CAPSULE | ORAL | Status: AC
  Administered 2020-11-01: 40 mg via ORAL
  Filled 2020-11-01: qty 1

## 2020-11-01 MED ORDER — FENTANYL CITRATE (PF) 100 MCG/2ML IJ SOLN
INTRAMUSCULAR | Status: DC | PRN
  Administered 2020-11-01: 50 ug via INTRAVENOUS
  Administered 2020-11-01 (×2): 25 ug via INTRAVENOUS

## 2020-11-01 MED ORDER — DEXAMETHASONE SODIUM PHOSPHATE 10 MG/ML IJ SOLN
INTRAMUSCULAR | Status: DC | PRN
  Administered 2020-11-01: 10 mg via INTRAVENOUS

## 2020-11-01 MED ORDER — OXYCODONE HCL 5 MG/5ML PO SOLN: 5.0000 mg | Freq: Once | ORAL | Status: AC | PRN

## 2020-11-01 SURGICAL SUPPLY — 44 items
BLADE SURG MINI STRL (BLADE) ×2 IMPLANT
BNDG ELASTIC 4X5.8 VLCR NS LF (GAUZE/BANDAGES/DRESSINGS) ×4 IMPLANT
BNDG ESMARK 4X12 TAN STRL LF (GAUZE/BANDAGES/DRESSINGS) ×2 IMPLANT
CORD BIP STRL DISP 12FT (MISCELLANEOUS) ×2 IMPLANT
CUFF TOURN SGL QUICK 18X4 (TOURNIQUET CUFF) IMPLANT
DRAPE ORTHO SPLIT 77X108 STRL (DRAPES) ×1
DRAPE SURG 17X11 SM STRL (DRAPES) ×2 IMPLANT
DRAPE SURG ORHT 6 SPLT 77X108 (DRAPES) ×1 IMPLANT
DRSG GAUZE FLUFF 36X18 (GAUZE/BANDAGES/DRESSINGS) ×2 IMPLANT
DURAPREP 26ML APPLICATOR (WOUND CARE) ×2 IMPLANT
ELECT REM PT RETURN 9FT ADLT (ELECTROSURGICAL) ×2
ELECTRODE REM PT RTRN 9FT ADLT (ELECTROSURGICAL) ×1 IMPLANT
FORCEPS JEWEL BIP 4-3/4 STR (INSTRUMENTS) ×2 IMPLANT
GAUZE 4X4 16PLY ~~LOC~~+RFID DBL (SPONGE) ×2 IMPLANT
GAUZE SPONGE 4X4 12PLY STRL (GAUZE/BANDAGES/DRESSINGS) ×2 IMPLANT
GAUZE XEROFORM 1X8 LF (GAUZE/BANDAGES/DRESSINGS) ×2 IMPLANT
GLOVE SURG ORTHO LTX SZ9 (GLOVE) ×4 IMPLANT
GLOVE SURG UNDER POLY LF SZ9 (GLOVE) ×2 IMPLANT
GOWN STRL REUS TWL 2XL XL LVL4 (GOWN DISPOSABLE) ×2 IMPLANT
GOWN STRL REUS W/ TWL LRG LVL3 (GOWN DISPOSABLE) ×1 IMPLANT
GOWN STRL REUS W/TWL LRG LVL3 (GOWN DISPOSABLE) ×1
KIT TURNOVER KIT A (KITS) ×2 IMPLANT
MANIFOLD NEPTUNE II (INSTRUMENTS) ×2 IMPLANT
NEEDLE FILTER BLUNT 18X 1/2SAF (NEEDLE)
NEEDLE FILTER BLUNT 18X1 1/2 (NEEDLE) IMPLANT
NS IRRIG 500ML POUR BTL (IV SOLUTION) ×2 IMPLANT
PACK EXTREMITY ARMC (MISCELLANEOUS) ×2 IMPLANT
PAD CAST CTTN 4X4 STRL (SOFTGOODS) ×2 IMPLANT
PADDING CAST 4IN STRL (MISCELLANEOUS) ×3
PADDING CAST BLEND 4X4 STRL (MISCELLANEOUS) ×3 IMPLANT
PADDING CAST COTTON 4X4 STRL (SOFTGOODS) ×2
SLING ARM LRG DEEP (SOFTGOODS) ×2 IMPLANT
SLING ARM M TX990204 (SOFTGOODS) ×2 IMPLANT
SPLINT CAST 1 STEP 3X12 (MISCELLANEOUS) ×2 IMPLANT
STOCKINETTE STRL 4IN 9604848 (GAUZE/BANDAGES/DRESSINGS) ×2 IMPLANT
STRIP CLOSURE SKIN 1/2X4 (GAUZE/BANDAGES/DRESSINGS) ×2 IMPLANT
SUT ETHILON 3-0 FS-10 30 BLK (SUTURE) ×2
SUT ETHILON 4-0 (SUTURE) ×1
SUT ETHILON 4-0 FS2 18XMFL BLK (SUTURE) ×1
SUT ETHILON 5-0 FS-2 18 BLK (SUTURE) ×2 IMPLANT
SUTURE EHLN 3-0 FS-10 30 BLK (SUTURE) ×1 IMPLANT
SUTURE ETHLN 4-0 FS2 18XMF BLK (SUTURE) ×1 IMPLANT
SYR 3ML LL SCALE MARK (SYRINGE) IMPLANT
WATER STERILE IRR 500ML POUR (IV SOLUTION) IMPLANT

## 2020-11-01 NOTE — H&P (Signed)
PREOPERATIVE H&P  Chief Complaint: right carpal tunnel syndrome  HPI: Lauren Higgins is a 50 y.o. female who presents for preoperative history and physical with a diagnosis of right carpal tunnel syndrome. Symptoms of paresthesias, weakness and pain are significantly impairing activities of daily living.  Severe right carpal tunnel syndrome has been confirmed by EMG and nerve conduction study.  Patient failed nonoperative management and wished to proceed with open carpal tunnel release.  Past Medical History:  Diagnosis Date   Contraception management 02/01/2012   Depression    Graves disease    Hypertension    Hypothyroidism    following iodine ablation   Melanoma (HCC)    Menorrhagia    Obesity    PONV (postoperative nausea and vomiting)    Pre-diabetes    Thyroid eye disease    Past Surgical History:  Procedure Laterality Date   ENDOMETRIAL ABLATION  05/07/2017   Dr Kenton Kingfisher   ENDOMETRIAL BIOPSY  2012   proliferative with focal breakdown   EYE SURGERY     multiple eye decompression surgeries from 2015 till present   radioactive iodine ablation     Graves disease   STRABISMUS SURGERY  03/04/2018   Social History   Socioeconomic History   Marital status: Married    Spouse name: Not on file   Number of children: 1   Years of education: Not on file   Highest education level: Not on file  Occupational History   Occupation: Education officer, museum  Tobacco Use   Smoking status: Never   Smokeless tobacco: Never  Vaping Use   Vaping Use: Never used  Substance and Sexual Activity   Alcohol use: No   Drug use: No   Sexual activity: Yes    Partners: Male    Birth control/protection: Condom, Surgical    Comment: Vasectomy  Other Topics Concern   Not on file  Social History Narrative   Not on file   Social Determinants of Health   Financial Resource Strain: Not on file  Food Insecurity: Not on file  Transportation Needs: Not on file  Physical Activity: Not on file   Stress: Not on file  Social Connections: Not on file   Family History  Problem Relation Age of Onset   Thyroid disease Father    Hypertension Father    Skin cancer Father    Dementia Maternal Grandmother        dementia   Breast cancer Maternal Grandmother 95   Stroke Paternal Grandfather    Glaucoma Mother    Hypertension Mother    Breast cancer Paternal Aunt 37   Cancer Cousin 12   No Known Allergies Prior to Admission medications   Medication Sig Start Date End Date Taking? Authorizing Provider  Cholecalciferol (VITAMIN D3) 125 MCG (5000 UT) CAPS Take 5,000 Units by mouth daily.   Yes [provider]  Cyanocobalamin (VITAMIN B-12) 5000 MCG SUBL Place 5,000 mcg under the tongue 3 (three) times a week.   Yes [provider]  hydrochlorothiazide (HYDRODIURIL) 25 MG tablet Take 25 mg by mouth daily. 04/01/17  Yes [provider]  levOCARNitine (L-CARNITINE) 250 MG CAPS Take 2 capsules by mouth daily.   Yes [provider]  MAGNESIUM OXIDE 400 PO Take 400 mg by mouth daily.   Yes [provider]  meloxicam (MOBIC) 15 MG tablet Take 15 mg by mouth daily.   Yes [provider]  metFORMIN (GLUCOPHAGE) 500 MG tablet Take 500-1,000 mg by mouth See  admin instructions. 500 mg in the morning, 1000 mg in the evening   Yes [provider]  Misc Natural Products (ELDERBERRY IMMUNE COMPLEX) CHEW Chew 2 each by mouth daily.   Yes [provider]  Multiple Vitamins-Minerals (MULTIVITAMIN ADULT) CHEW Chew 2 each by mouth daily. [place]   Yes [provider]  NP THYROID 30 MG tablet Take 60 mg by mouth in the morning and at bedtime. 03/30/17  Yes [provider]  phentermine (ADIPEX-P) 37.5 MG tablet Take 37.5 mg by mouth daily before breakfast. 03/17/13  Yes [provider]  Probiotic Product (PROBIOTIC DAILY PO) Take by mouth.   Yes [provider]  Probiotic Product (PROBIOTIC-10 PO) Take 1  capsule by mouth daily.   Yes [provider]     Positive ROS: All other systems have been reviewed and were otherwise negative with the exception of those mentioned in the HPI and as above.  Physical Exam: General: Alert, no acute distress Cardiovascular: Regular rate and rhythm, no murmurs rubs or gallops.  No pedal edema Respiratory: Clear to auscultation bilaterally, no wheezes rales or rhonchi. No cyanosis, no use of accessory musculature GI: No organomegaly, abdomen is soft and non-tender nondistended with positive bowel sounds. Skin: Skin intact, no lesions within the operative field. Neurologic: Sensation intact distally Psychiatric: Patient is competent for consent with normal mood and affect Lymphatic: No cervical lymphadenopathy  MUSCULOSKELETAL: Right hand: Patient has paresthesias in the thumb index and middle finger.  She does not have significant thenar atrophy or detectable pinch weakness.  Patient has no interossei atrophy.  Patient can flex and extend her digits on the right hand without limitation.  Her fingers are well-perfused and she has a palpable radial pulse.  Assessment: right carpal tunnel syndrome  Plan: Plan for Procedure(s): RIGHT CARPAL TUNNEL RELEASE  Admit the patient in the preoperative area.  I marked the right wrist according to hospital's correct site of surgery protocol.  I performed a preop history and physical.  I discussed the details of the operation as well as the postoperative course.  I answered the patient's questions.  I discussed the risks and benefits of surgery. The risks include but are not limited to infection, bleeding, nerve or blood vessel injury specially injury to the median nerve, joint stiffness or loss of motion, persistent pain, weakness or instability, recurrent carpal tunnel symptoms and the need for further surgery.  Patient understood these risks and wished to proceed.     Thornton Park,  MD   11/01/2020 11:54 AM

## 2020-11-01 NOTE — Op Note (Signed)
  11/01/2020  1:22 PM  PATIENT:  Lauren Higgins    PRE-OPERATIVE DIAGNOSIS:  right carpal tunnel syndrome  POST-OPERATIVE DIAGNOSIS:  Same  PROCEDURE:  RIGHT CARPAL TUNNEL RELEASE  SURGEON:  Thornton Park, MD  ANESTHESIA:   General  PREOPERATIVE INDICATIONS:  Lauren Higgins is a  50 y.o. female with a diagnosis of right carpal tunnel syndrome who failed conservative measures and elected for surgical management.    I discussed the risks and benefits of surgery. The risks include but are not limited to infection, bleeding, nerve or blood vessel injury, joint stiffness or loss of motion, persistent pain, weakness, recurrence of symptoms and the need for further surgery. Medical risks include but are not limited to DVT and pulmonary embolism, myocardial infarction, stroke, pneumonia, respiratory failure and death. Patient understood these risks and wished to proceed.   OPERATIVE FINDINGS: Significant median nerve compression at the carpal tunnel, right upper extremity  OPERATIVE PROCEDURE: Patient was met in the preoperative area. I signed the right wrist with my initials and the word yes according the hospital's correct site of surgery protocol. A pre-op H&P was performed. I answered all the patient's questions. The patient was then brought to the operating room where she underwent general anesthesia.  She was positioned supine on the operative table. The right arm was placed on a hand table. A tourniquet was applied to the right upper extremity. The patient was then prepped and draped in a sterile fashion. A timeout was performed to verify the patient's name, date of birth, medical record number, correct site of surgery correct procedure to be performed. The time out was also used to confirm the patient received antibiotics that all necessary instruments were available in the room. The right upper extremity was then exsanguinated with an Esmarch and the tourniquet inflated to 250 mmHg.   An  incision following the palmar crease was made. This was made in line with the web space between the middle and ring fingers and the distal extent of the incision was where it intersected Kaplan's cardinal line. Bleeding vessels were cauterized with a bipolar.  The subcutaneous tissue was carefully dissected out with a Metzenbaum scissor and pickup until the palmar fascia was encountered. The distal extent of the transverse carpal ligament was then identified. A Freer elevator was placed under the transverse carpal ligament running distally to proximally. A micro-Beaver blade was then used to incise the transverse carpal ligament taking care to avoid injury to any neurovascular structures. The carpal tunnel was found to be extremely constricted. There was significant compression on the median nerve. The transverse carpal ligament was completely released. The nerve was visualized in its entirety and the carpal tunnel. The wound was copiously irrigated. The skin was then approximated with 5-0 nylon. Xeroform was placed over the incision. A dry sterile dressing was applied along with a volar fiberglass splint. Patient was overwrapped with an Ace wrap. The tourniquet was deflated at 22 minutes.  Sling was placed on the right upper extremity. She was then extubated and brought to the PACU in stable condition. I was scrubbed and present the entire case and all sharp and instrument counts were correct at the conclusion the case.      Timoteo Gaul, MD

## 2020-11-01 NOTE — Transfer of Care (Signed)
Immediate Anesthesia Transfer of Care Note  Patient: Lauren Higgins  Procedure(s) Performed: RIGHT CARPAL TUNNEL RELEASE (Right: Wrist)  Patient Location: PACU  Anesthesia Type:General  Level of Consciousness: drowsy and patient cooperative  Airway & Oxygen Therapy: Patient Spontanous Breathing and Patient connected to face mask oxygen  Post-op Assessment: Report given to RN and Post -op Vital signs reviewed and stable  Post vital signs: Reviewed and stable  Last Vitals:  Vitals Value Taken Time  BP 126/82 11/01/20 1317  Temp 36.1 C 11/01/20 1315  Pulse 72 11/01/20 1317  Resp 20 11/01/20 1317  SpO2 100 % 11/01/20 1317    Last Pain:  Vitals:   11/01/20 1122  TempSrc: Oral  PainSc: 0-No pain         Complications: No notable events documented.

## 2020-11-01 NOTE — Anesthesia Preprocedure Evaluation (Addendum)
Anesthesia Evaluation  Patient identified by MRN, date of birth, ID band Patient awake    Reviewed: Allergy & Precautions, NPO status , Patient's Chart, lab work & pertinent test results  History of Anesthesia Complications (+) PONV and history of anesthetic complications  Airway Mallampati: III  TM Distance: >3 FB Neck ROM: Full    Dental no notable dental hx. (+) Teeth Intact   Pulmonary neg pulmonary ROS,    Pulmonary exam normal        Cardiovascular Exercise Tolerance: Good hypertension, Pt. on medications Normal cardiovascular exam     Neuro/Psych PSYCHIATRIC DISORDERS Depression negative neurological ROS     GI/Hepatic negative GI ROS, Neg liver ROS,   Endo/Other  diabetes, Type 2, Oral Hypoglycemic AgentsHypothyroidism   Renal/GU negative Renal ROS  negative genitourinary   Musculoskeletal  (+) Arthritis , Osteoarthritis,    Abdominal (+) + obese,   Peds negative pediatric ROS (+)  Hematology negative hematology ROS (+)   Anesthesia Other Findings   Reproductive/Obstetrics negative OB ROS                            Anesthesia Physical Anesthesia Plan  ASA: 2  Anesthesia Plan: General   Post-op Pain Management:    Induction: Intravenous  PONV Risk Score and Plan: 4 or greater and Ondansetron, Dexamethasone, Midazolam, TIVA and Aprepitant  Airway Management Planned: LMA  Additional Equipment:   Intra-op Plan:   Post-operative Plan: Extubation in OR  Informed Consent: I have reviewed the patients History and Physical, chart, labs and discussed the procedure including the risks, benefits and alternatives for the proposed anesthesia with the patient or authorized representative who has indicated his/her understanding and acceptance.     Dental advisory given  Plan Discussed with: CRNA and Anesthesiologist  Anesthesia Plan Comments:         Anesthesia Quick  Evaluation

## 2020-11-01 NOTE — Discharge Instructions (Signed)

## 2020-11-02 ENCOUNTER — Encounter: Payer: Self-pay | Admitting: Orthopedic Surgery

## 2020-11-04 NOTE — Anesthesia Postprocedure Evaluation (Signed)
Anesthesia Post Note  Patient: Lauren Higgins  Procedure(s) Performed: RIGHT CARPAL TUNNEL RELEASE (Right: Wrist)  Patient location during evaluation: PACU Anesthesia Type: General Level of consciousness: awake and alert Pain management: pain level controlled Vital Signs Assessment: post-procedure vital signs reviewed and stable Respiratory status: spontaneous breathing, nonlabored ventilation and respiratory function stable Cardiovascular status: blood pressure returned to baseline and stable Postop Assessment: no apparent nausea or vomiting Anesthetic complications: no   No notable events documented.   Last Vitals:  Vitals:   11/01/20 1402 11/01/20 1427  BP: 134/90 119/84  Pulse: 60 65  Resp: 14 15  Temp: (!) 36.3 C (!) 36.3 C  SpO2: 95% 98%    Last Pain:  Vitals:   11/01/20 1427  TempSrc: Temporal  PainSc: 3                  Iran Ouch

## 2021-01-12 DIAGNOSIS — M25562 Pain in left knee: Secondary | ICD-10-CM | POA: Diagnosis not present

## 2021-01-23 DIAGNOSIS — R946 Abnormal results of thyroid function studies: Secondary | ICD-10-CM | POA: Diagnosis not present

## 2021-01-23 DIAGNOSIS — E063 Autoimmune thyroiditis: Secondary | ICD-10-CM | POA: Diagnosis not present

## 2021-05-06 DIAGNOSIS — R946 Abnormal results of thyroid function studies: Secondary | ICD-10-CM | POA: Diagnosis not present

## 2021-05-06 DIAGNOSIS — E063 Autoimmune thyroiditis: Secondary | ICD-10-CM | POA: Diagnosis not present

## 2021-05-28 ENCOUNTER — Ambulatory Visit (INDEPENDENT_AMBULATORY_CARE_PROVIDER_SITE_OTHER): Payer: 59

## 2021-05-28 ENCOUNTER — Telehealth: Payer: Self-pay

## 2021-05-28 DIAGNOSIS — R319 Hematuria, unspecified: Secondary | ICD-10-CM | POA: Diagnosis not present

## 2021-05-28 LAB — POCT URINALYSIS DIPSTICK
Appearance: NORMAL
Bilirubin, UA: NEGATIVE
Blood, UA: NEGATIVE
Glucose, UA: NEGATIVE
Ketones, UA: NEGATIVE
Leukocytes, UA: NEGATIVE
Nitrite, UA: NEGATIVE
Odor: NORMAL
Protein, UA: NEGATIVE
Spec Grav, UA: 1.025 (ref 1.010–1.025)
Urobilinogen, UA: 0.2 E.U./dL
pH, UA: 5 (ref 5.0–8.0)

## 2021-05-28 NOTE — Progress Notes (Signed)
Patient reports to clinic for urine drop off with complaint of blood in urine. She has been on a 10d course of Augmentin for sinus infection. She is completing this regimen today. Urine dipstick shows negative for all components. Culture sent.

## 2021-05-28 NOTE — Telephone Encounter (Signed)
Pt calling; blood in urine; doesn't feel well; today is last dose of antibx for sinus inf.  [phone]  Pt states she was given Augmentin for 10d; doesn't know the dose.  Adv can come in to give a urine sample or see her primary care; pt opts to come in and leave sample.  Tx'd to [facility] for scheduling.

## 2021-05-31 LAB — URINE CULTURE

## 2021-07-02 DIAGNOSIS — L308 Other specified dermatitis: Secondary | ICD-10-CM | POA: Diagnosis not present

## 2021-07-02 DIAGNOSIS — D2372 Other benign neoplasm of skin of left lower limb, including hip: Secondary | ICD-10-CM | POA: Diagnosis not present

## 2021-07-02 DIAGNOSIS — Z85820 Personal history of malignant melanoma of skin: Secondary | ICD-10-CM | POA: Diagnosis not present

## 2021-07-02 DIAGNOSIS — Z86018 Personal history of other benign neoplasm: Secondary | ICD-10-CM | POA: Diagnosis not present

## 2021-07-02 DIAGNOSIS — D485 Neoplasm of uncertain behavior of skin: Secondary | ICD-10-CM | POA: Diagnosis not present

## 2021-07-02 DIAGNOSIS — Z85828 Personal history of other malignant neoplasm of skin: Secondary | ICD-10-CM | POA: Diagnosis not present

## 2021-07-02 DIAGNOSIS — Z872 Personal history of diseases of the skin and subcutaneous tissue: Secondary | ICD-10-CM | POA: Diagnosis not present

## 2021-07-02 DIAGNOSIS — L578 Other skin changes due to chronic exposure to nonionizing radiation: Secondary | ICD-10-CM | POA: Diagnosis not present

## 2021-07-02 DIAGNOSIS — D2222 Melanocytic nevi of left ear and external auricular canal: Secondary | ICD-10-CM | POA: Diagnosis not present

## 2021-07-02 DIAGNOSIS — Z859 Personal history of malignant neoplasm, unspecified: Secondary | ICD-10-CM | POA: Diagnosis not present

## 2021-07-08 ENCOUNTER — Ambulatory Visit: Payer: Managed Care, Other (non HMO) | Admitting: Obstetrics

## 2021-07-15 ENCOUNTER — Ambulatory Visit (INDEPENDENT_AMBULATORY_CARE_PROVIDER_SITE_OTHER): Payer: 59 | Admitting: Obstetrics

## 2021-07-15 VITALS — BP 134/78 | Ht 69.0 in | Wt 239.0 lb

## 2021-07-15 DIAGNOSIS — Z1231 Encounter for screening mammogram for malignant neoplasm of breast: Secondary | ICD-10-CM

## 2021-07-15 DIAGNOSIS — Z1211 Encounter for screening for malignant neoplasm of colon: Secondary | ICD-10-CM | POA: Diagnosis not present

## 2021-07-15 DIAGNOSIS — Z9889 Other specified postprocedural states: Secondary | ICD-10-CM | POA: Diagnosis not present

## 2021-07-15 DIAGNOSIS — Z01419 Encounter for gynecological examination (general) (routine) without abnormal findings: Secondary | ICD-10-CM | POA: Diagnosis not present

## 2021-07-15 NOTE — Progress Notes (Signed)
Gynecology Annual Exam  PCP: Gae Bon, NP  Chief Complaint:  Chief Complaint  Patient presents with   Annual Exam    History of Present Illness: Patient is a 51 y.o. G1P1001 presents for annual exam. The patient has one complaint today. She has noticed some irregular spotting this year, and this had not occurred since her ablation was performed about three years ago. The bleeding is light pink in color, with no regular pattern. She is also requesting referral to a GI provider for her first colonoscopy.  LMP: No LMP recorded. Patient has had an ablation.  The patient is sexually active. She currently uses vasectomy for contraception. She denies dyspareunia.  The patient does perform self breast exams.  There is no notable family history of breast or ovarian cancer in her family.  The patient wears seatbelts: yes.   The patient has regular exercise: yes.    The patient denies current symptoms of depression.    Review of Systems: Review of Systems  Constitutional: Negative.   HENT: Negative.    Eyes: Negative.   Respiratory: Negative.    Cardiovascular: Negative.   Gastrointestinal: Negative.   Genitourinary: Negative.   Musculoskeletal: Negative.   Skin: Negative.   Neurological: Negative.   Endo/Heme/Allergies: Negative.   Psychiatric/Behavioral: Negative.      Past Medical History:  Patient Active Problem List   Diagnosis Date Noted   History of endometrial ablation 07/15/2021   Mechanical lagophthalmos, bilateral, upper and lower eyelids 11/03/2018   Hypertropia of right eye 11/20/2017    Formatting of this note might be different from the original. 11/20/2017 RHT fairly comitant in lateral gazes, minimal in DG CT sinus reviewed. LIR muscle much larger than RIR. Recommend asymmetric IR recession OS>OD Could also consider RSR recession FDT  01/20/2018 Plan for LIR recess (prev recessed 51m) Mersilene Adjustable  Last Assessment & Plan:   Formatting of this note might be different from the original. 11/20/2017 RHT fairly comitant in lateral gazes, minimal in DG CT sinus reviewed. LIR muscle much larger than RIR. 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    Other specified postprocedural states 06/01/2014    Formatting of this note might be different from the original. 03/18/2018 Looks very good No diplopia in PP and downgaze She tolerates some prism in primary position so there is room to correct her right hypertropia in side gazes Patient would like to get new glasses but right now is too soon Will have her return in 4-6 weeks and re-refract at that time  Last Assessment & Plan:  Formatting of this note might be different from the original. 03/18/2018 Looks very good No diplopia in PP and downgaze She tolerates some prism in primary position so there is room to correct her right hypertropia in side gazes Patient would like to get new glasses but right now is  too soon Will have her return in 4-6 weeks and re-refract at that time    Monocular esotropia of right eye 02/08/2014    Formatting of this note might be different from the original. 11/20/2017 ET fairly comitant Recommend BMR recess vs. BLR resect, Forced duction testing CT sinus reviewed - significant asymmetry MR much  larger than LR  01/20/2018 Target ET 18-20  FDT. MR already recessed 5-5.55m. May need LR resect    Last Assessment & Plan:  Formatting of this note might be different from the original. 11/20/2017 ET fairly comitant Recommend BMR recess vs. BLR resect, Forced duction testing CT sinus reviewed - significant asymmetry MR much larger than LR  01/20/2018 Target ET 18-20  FDT. MR already recessed 5-5.554m May need LR resect    Diplopia 02/01/2014    Formatting of this note might be different from the original. 05/05/2018 Resolved   11/03/2018 Intermittent  Only present in downgaze when she is tired  Small angle intermittent RHT 1 PD .  2 PD intermittent ET in RG  Recommend to observe   Last Assessment & Plan:  Formatting of this note might be different from the original. 11/03/2018 Intermittent  Only present in downgaze when she is tired  Small angle intermittent RHT 1 PD .  2 PD intermittent ET in RG  Recommend to observe    Essential hypertension with goal blood pressure less than 140/90 11/23/2013   Non morbid obesity due to excess calories 11/23/2013   Pre-diabetes 11/23/2013   Vitamin D deficiency 02/15/2013    Vit d level was 21 in feb    Bursitis of knee 02/06/2013   Visit for preventive health examination 02/04/2013   Obesity (BMI 30.0-34.9) 02/01/2012    Prescribed appetite suppressant by dr. SoGabriel Carina    Hypothyroidism (acquired) 02/01/2012    Secondary to radioactive iodine treatment for hyperthyroidism.     Past Surgical History:  Past Surgical History:  Procedure Laterality Date   CARPAL TUNNEL RELEASE Right 11/01/2020   Procedure: RIGHT CARPAL TUNNEL RELEASE;  Surgeon: KrThornton ParkMD;  Location: [facility] ORS;  Service: Orthopedics;  Laterality: Right;   ENDOMETRIAL ABLATION  05/07/2017   Dr HaKenton Kingfisher ENDOMETRIAL BIOPSY  2012   proliferative with focal breakdown   EYE SURGERY     multiple eye decompression surgeries from 2015 till present    radioactive iodine ablation     Graves disease   STRABISMUS SURGERY  03/04/2018    Gynecologic History:  No LMP recorded. Patient has had an ablation. Contraception: vasectomy Last Pap: Results were: NILM no abnormalities  Last mammogram: 2022 Results were: BI-RAD I  Obstetric History: G1P1001  Family History:  Family History  Problem Relation Age of Onset   Thyroid disease Father    Hypertension Father    Skin cancer Father    Dementia Maternal Grandmother        dementia   Breast cancer Maternal Grandmother [phone] Stroke Paternal Grandfather    Glaucoma Mother    Hypertension Mother    Breast cancer Paternal Aunt [phone] Cancer Cousin 12    Social History:  Social History   Socioeconomic History   Marital status: Married    Spouse name: Not on file   Number of children: 1   Years of education: Not on file   Highest education level: Not on file  Occupational History   Occupation: soEducation officer, museumTobacco Use   Smoking status: Never   Smokeless  tobacco: Never  Vaping Use   Vaping Use: Never used  Substance and Sexual Activity   Alcohol use: No   Drug use: No   Sexual activity: Yes    Partners: Male    Birth control/protection: Condom, Surgical    Comment: Vasectomy  Other Topics Concern   Not on file  Social History Narrative   Not on file   Social Determinants of Health   Financial Resource Strain: Not on file  Food Insecurity: Not on file  Transportation Needs: Not on file  Physical Activity: Sufficiently Active (04/02/2017)   Exercise Vital Sign    Days of Exercise per Week: 4 days    Minutes of Exercise per Session: 60 min  Stress: No Stress Concern Present (04/02/2017)   [place]    Feeling of Stress : Not at all  Social Connections: Not on file  Intimate Partner Violence: Not on file    Allergies:  No Known Allergies  Medications: Prior to Admission medications   Medication  Sig Start Date End Date Taking? Authorizing Provider  Cholecalciferol (VITAMIN D3) 125 MCG (5000 UT) CAPS Take 5,000 Units by mouth daily.   Yes [provider]  Cyanocobalamin (VITAMIN B-12) 5000 MCG SUBL Place 5,000 mcg under the tongue 3 (three) times a week.   Yes [provider]  hydrochlorothiazide (HYDRODIURIL) 25 MG tablet Take 25 mg by mouth daily. 04/01/17  Yes [provider]  levOCARNitine (L-CARNITINE) 250 MG CAPS Take 2 capsules by mouth daily.   Yes [provider]  MAGNESIUM OXIDE 400 PO Take 400 mg by mouth daily.   Yes [provider]  meloxicam (MOBIC) 15 MG tablet Take 15 mg by mouth daily.   Yes [provider]  metFORMIN (GLUCOPHAGE) 500 MG tablet Take 500-1,000 mg by mouth See admin instructions. 500 mg in the morning, 1000 mg in the evening   Yes [provider]  Misc Natural Products (ELDERBERRY IMMUNE COMPLEX) CHEW Chew 2 each by mouth daily.   Yes [provider]  NP THYROID 30 MG tablet Take 60 mg by mouth in the morning and at bedtime. 03/30/17  Yes [provider]  phentermine (ADIPEX-P) 37.5 MG tablet Take 37.5 mg by mouth daily before breakfast. 03/17/13  Yes [provider]  Probiotic Product (PROBIOTIC DAILY PO) Take by mouth.   Yes [provider]  Probiotic Product (PROBIOTIC-10 PO) Take 1 capsule by mouth daily.   Yes [provider]    Physical Exam Vitals: Blood pressure 134/78, height '5\' 9"'$  (1.753 m), weight 239 lb (108.4 kg).  General: NAD HEENT: normocephalic, anicteric Thyroid: no enlargement, no palpable nodules Pulmonary: No increased work of breathing, CTAB Cardiovascular: RRR, distal pulses 2+ Breast: Breast symmetrical, no tenderness, no palpable nodules or masses, no skin or nipple retraction present, no nipple discharge.  No axillary or supraclavicular lymphadenopathy. Abdomen: NABS, soft, non-tender, non-distended.  Umbilicus without  lesions.  No hepatomegaly, splenomegaly or masses palpable. No evidence of hernia  Genitourinary:  External: Normal external female genitalia.  Normal urethral meatus, normal Bartholin's and Skene's glands.    Vagina: Normal vaginal mucosa, no evidence of prolapse.    Cervix: Grossly normal in appearance, no bleeding  Uterus: Non-enlarged, mobile, normal contour.  No CMT  Adnexa: ovaries non-enlarged, no adnexal masses  Rectal: deferred  Lymphatic: no evidence of inguinal lymphadenopathy Extremities: no edema, erythema, or tenderness Neurologic: Grossly intact Psychiatric: mood appropriate, affect full  Female  chaperone present for pelvic and breast  portions of the physical exam    Assessment: 51 y.o. G1P1001 routine annual exam  Plan: Problem List Items Addressed This Visit       Other   History of endometrial ablation   Other Visit Diagnoses     Women's annual routine gynecological examination    -  Primary       1) Mammogram - recommend yearly screening mammogram.  Mammogram Was ordered today   2) STI screening  wasoffered and declined  3) ASCCP guidelines and rational discussed.  Patient opts for every 3 years screening interval  4) Contraception - the patient is currently using  vasectomy.  She is happy with her current form of contraception and plans to continue  5) Colonoscopy -- Screening recommended starting at age 54 for average risk individuals, age 41 for individuals deemed at increased risk (including African Americans) and recommended to continue until age 20.  For patient age 84-85 individualized approach is recommended.  Gold standard screening is via colonoscopy, Cologuard screening is an acceptable alternative for patient unwilling or unable to undergo colonoscopy.  "Colorectal cancer screening for average?risk adults: 2018 guideline update from the American Cancer Society"CA: A Cancer Journal for Clinicians: Jun 04, 2016  I have made a referral to a local  GI provider to set her up for colonoscopy.  6) Routine healthcare maintenance including cholesterol, diabetes screening discussed managed by PCP  7) Return in about 1 year (around 07/16/2022).  We drew a [facility] today to begin addressing her c/o irregular spotting that has occurred this year. Based on the results, if she is postmenopausal, then will order a pelvic ultrasound, and then consider endometrial biopsy. Will call the patient once the blood work is back.  Imagene Riches, CNM  07/15/2021 2:24 PM   [facility] 07/15/2021, 2:24 PM

## 2021-07-16 ENCOUNTER — Telehealth: Payer: Self-pay

## 2021-07-16 LAB — FOLLICLE STIMULATING HORMONE: FSH: 26.8 m[IU]/mL

## 2021-07-16 NOTE — Telephone Encounter (Signed)
Spoke w/patient. Advised Joycelyn Schmid is out of the office until Thursday. Message is being forwarded for her review.

## 2021-07-16 NOTE — Telephone Encounter (Signed)
[facility] VOICEMAIL: Patient reports she was seen by Gigi Gin yesterday. She had some test done. She has seen those results in my chart. She is waiting to here back from her on next steps. Inquiring when she will hear something. #[phone].

## 2021-07-18 ENCOUNTER — Encounter: Payer: Self-pay | Admitting: Obstetrics

## 2021-07-18 NOTE — Telephone Encounter (Signed)
CALLED PATIENT NO ANSWER LEFT VOICEMAIL FOR A CALL BACK ? ?

## 2021-07-19 ENCOUNTER — Telehealth: Payer: Self-pay

## 2021-07-19 NOTE — Telephone Encounter (Signed)
CALLED PATIENT NO ANSWER LEFT VOICEMAIL FOR A CALL BACK °Letter sent °

## 2021-07-31 ENCOUNTER — Other Ambulatory Visit: Payer: Self-pay | Admitting: Obstetrics

## 2021-07-31 ENCOUNTER — Encounter: Payer: Self-pay | Admitting: Obstetrics

## 2021-07-31 DIAGNOSIS — N950 Postmenopausal bleeding: Secondary | ICD-10-CM

## 2021-08-05 ENCOUNTER — Ambulatory Visit
Admission: RE | Admit: 2021-08-05 | Discharge: 2021-08-05 | Disposition: A | Discharge status: Home / Self Care | Payer: 59 | Source: Ambulatory Visit | Attending: Obstetrics | Admitting: Obstetrics

## 2021-08-05 DIAGNOSIS — Z1231 Encounter for screening mammogram for malignant neoplasm of breast: Secondary | ICD-10-CM | POA: Diagnosis not present

## 2021-08-08 ENCOUNTER — Other Ambulatory Visit: Payer: Self-pay | Admitting: Obstetrics

## 2021-08-08 ENCOUNTER — Ambulatory Visit (INDEPENDENT_AMBULATORY_CARE_PROVIDER_SITE_OTHER): Payer: 59

## 2021-08-08 DIAGNOSIS — N950 Postmenopausal bleeding: Secondary | ICD-10-CM | POA: Diagnosis not present

## 2021-08-13 ENCOUNTER — Ambulatory Visit: Payer: 59 | Admitting: Obstetrics & Gynecology

## 2021-08-13 ENCOUNTER — Encounter: Payer: Self-pay | Admitting: Obstetrics & Gynecology

## 2021-08-13 VITALS — BP 112/70 | Wt 242.0 lb

## 2021-08-13 DIAGNOSIS — N926 Irregular menstruation, unspecified: Secondary | ICD-10-CM

## 2021-08-13 NOTE — Progress Notes (Signed)
   Subjective:    Patient ID: Lauren Higgins, female    DOB: 07-27-1970, 51 y.o.   MRN: [id number]  HPI  51yo P1 here to discuss her ultrasound. This was done for some irregular spotting this year. This was the first bleeding she has seen since her ablation with Dr. Kenton Kingfisher on 05/07/2017.  Review of Systems     Objective:   Physical Exam Well nourished, well hydrated White female, no apparent distress She is ambulating and conversing normally.      Assessment & Plan:   Irregular bleeding with FSH of 26- question perimenopausal bleeding Ultrasound with 3.37 mm thickness, ruling out uterine cancer If this continues, rec further eval I offered to repeat Bigelow to confirm if she is peri or post menopausal.

## 2021-10-07 DIAGNOSIS — H40023 Open angle with borderline findings, high risk, bilateral: Secondary | ICD-10-CM | POA: Diagnosis not present

## 2021-10-07 DIAGNOSIS — E0590 Thyrotoxicosis, unspecified without thyrotoxic crisis or storm: Secondary | ICD-10-CM | POA: Diagnosis not present

## 2021-10-07 DIAGNOSIS — H5213 Myopia, bilateral: Secondary | ICD-10-CM | POA: Diagnosis not present

## 2021-10-07 DIAGNOSIS — H524 Presbyopia: Secondary | ICD-10-CM | POA: Diagnosis not present

## 2021-10-07 DIAGNOSIS — H52223 Regular astigmatism, bilateral: Secondary | ICD-10-CM | POA: Diagnosis not present

## 2021-10-08 DIAGNOSIS — Z7689 Persons encountering health services in other specified circumstances: Secondary | ICD-10-CM | POA: Diagnosis not present

## 2021-11-14 DIAGNOSIS — H40019 Open angle with borderline findings, low risk, unspecified eye: Secondary | ICD-10-CM | POA: Diagnosis not present

## 2021-11-14 DIAGNOSIS — E079 Disorder of thyroid, unspecified: Secondary | ICD-10-CM | POA: Diagnosis not present

## 2021-11-14 DIAGNOSIS — H5789 Other specified disorders of eye and adnexa: Secondary | ICD-10-CM | POA: Diagnosis not present

## 2021-11-14 DIAGNOSIS — E0500 Thyrotoxicosis with diffuse goiter without thyrotoxic crisis or storm: Secondary | ICD-10-CM | POA: Diagnosis not present

## 2021-11-25 DIAGNOSIS — E063 Autoimmune thyroiditis: Secondary | ICD-10-CM | POA: Diagnosis not present

## 2021-11-25 DIAGNOSIS — R946 Abnormal results of thyroid function studies: Secondary | ICD-10-CM | POA: Diagnosis not present

## 2021-12-15 DIAGNOSIS — M5416 Radiculopathy, lumbar region: Secondary | ICD-10-CM | POA: Diagnosis not present

## 2021-12-23 DIAGNOSIS — M1711 Unilateral primary osteoarthritis, right knee: Secondary | ICD-10-CM | POA: Diagnosis not present

## 2021-12-23 DIAGNOSIS — M5416 Radiculopathy, lumbar region: Secondary | ICD-10-CM | POA: Diagnosis not present

## 2022-02-03 DIAGNOSIS — E063 Autoimmune thyroiditis: Secondary | ICD-10-CM | POA: Diagnosis not present

## 2022-02-03 DIAGNOSIS — R946 Abnormal results of thyroid function studies: Secondary | ICD-10-CM | POA: Diagnosis not present

## 2022-04-21 DIAGNOSIS — E079 Disorder of thyroid, unspecified: Secondary | ICD-10-CM | POA: Diagnosis not present

## 2022-04-21 DIAGNOSIS — H40023 Open angle with borderline findings, high risk, bilateral: Secondary | ICD-10-CM | POA: Diagnosis not present

## 2022-04-21 DIAGNOSIS — H5789 Other specified disorders of eye and adnexa: Secondary | ICD-10-CM | POA: Diagnosis not present

## 2022-05-02 DIAGNOSIS — R946 Abnormal results of thyroid function studies: Secondary | ICD-10-CM | POA: Diagnosis not present

## 2022-05-02 DIAGNOSIS — E063 Autoimmune thyroiditis: Secondary | ICD-10-CM | POA: Diagnosis not present

## 2022-05-05 ENCOUNTER — Ambulatory Visit: Payer: 59 | Admitting: Orthopedic Surgery

## 2022-05-09 ENCOUNTER — Ambulatory Visit: Payer: 59 | Admitting: Orthopedic Surgery

## 2022-05-13 NOTE — Progress Notes (Unsigned)
Referring Physician:  Mirna Mires, CNM [address] [place],  [place] [address]  Primary Physician:  Mirna Mires, CNM  History of Present Illness: 05/14/2022 Ms. Lauren Higgins has a history of HTN, hypothyroidism, graves disease, obesity, hypercholesterolemia.   8-10 month history of more constant LBP with intermittent right lateral/posterior leg pain to her foot that is worse with stairs. She has intermittent left knee pain as well- she has known OA. She has numbness, tingling, and weakness in right leg. She is not sleeping due to pain.   Saw [facility] in December for her lumbar spine. Was sent to PT and saw a chiropractor instead. Was given prednisone, mobic, and muscle relaxer. Had some improvement with mobic.   Bowel/Bladder Dysfunction: none  Conservative measures:  Physical therapy: none. Seeing chiropractor with some relief.     Multimodal medical therapy including regular antiinflammatories: tylenol, advil, aleve, oxycodone, prednisone, mobic,  Injections: No epidural steroid injections  Past Surgery: No spinal surgery  Lauren Higgins has no symptoms of cervical myelopathy.  The symptoms are causing a significant impact on the patient's life.   Review of Systems:  A 10 point review of systems is negative, except for the pertinent positives and negatives detailed in the HPI.  Past Medical History: Past Medical History:  Diagnosis Date   Contraception management 02/01/2012   Depression    Graves disease    Hypertension    Hypothyroidism    following iodine ablation   Melanoma (HCC)    Menorrhagia    Obesity    PONV (postoperative nausea and vomiting)    Pre-diabetes    Thyroid eye disease     Past Surgical History: Past Surgical History:  Procedure Laterality Date   CARPAL TUNNEL RELEASE Right 11/01/2020   Procedure: RIGHT CARPAL TUNNEL RELEASE;  Surgeon: Juanell Fairly, MD;  Location: [facility] ORS;  Service: Orthopedics;  Laterality: Right;    ENDOMETRIAL ABLATION  05/07/2017   Dr Tiburcio Pea   ENDOMETRIAL BIOPSY  2012   proliferative with focal breakdown   EYE SURGERY     multiple eye decompression surgeries from 2015 till present   radioactive iodine ablation     Graves disease   STRABISMUS SURGERY  03/04/2018    Allergies: Allergies as of 05/14/2022   (No Known Allergies)    Medications: Outpatient Encounter Medications as of 05/14/2022  Medication Sig   Cholecalciferol (VITAMIN D3) 125 MCG (5000 UT) CAPS Take 5,000 Units by mouth daily.   Cyanocobalamin (VITAMIN B-12) 5000 MCG SUBL Place 5,000 mcg under the tongue 3 (three) times a week.   hydrochlorothiazide (HYDRODIURIL) 25 MG tablet Take 25 mg by mouth daily.   levOCARNitine (L-CARNITINE) 250 MG CAPS Take 2 capsules by mouth daily.   MAGNESIUM OXIDE 400 PO Take 400 mg by mouth daily.   metFORMIN (GLUCOPHAGE) 500 MG tablet Take 500-1,000 mg by mouth See admin instructions. 500 mg in the morning, 1000 mg in the evening   Misc Natural Products (ELDERBERRY IMMUNE COMPLEX) CHEW Chew 2 each by mouth daily.   NP THYROID 30 MG tablet Take 60 mg by mouth in the morning and at bedtime.   phentermine (ADIPEX-P) 37.5 MG tablet Take 37.5 mg by mouth daily before breakfast.   Probiotic Product (PROBIOTIC DAILY PO) Take by mouth.   No facility-administered encounter medications on file as of 05/14/2022.    Social History: Social History   Tobacco Use   Smoking status: Never   Smokeless tobacco: Never  Vaping Use  Vaping Use: Never used  Substance Use Topics   Alcohol use: No   Drug use: No    Family Medical History: Family History  Problem Relation Age of Onset   Thyroid disease Father    Hypertension Father    Skin cancer Father    Dementia Maternal Grandmother        dementia   Breast cancer Maternal Grandmother 95   Stroke Paternal Grandfather    Glaucoma Mother    Hypertension Mother    Breast cancer Paternal Aunt 34   Cancer Cousin 12    Physical  Examination: Vitals:   05/14/22 1010  BP: 124/71    General: Patient is well developed, well nourished, calm, collected, and in no apparent distress. Attention to examination is appropriate.  Respiratory: Patient is breathing without any difficulty.   NEUROLOGICAL:     Awake, alert, oriented to person, place, and time.  Speech is clear and fluent. Fund of knowledge is appropriate.   Cranial Nerves: Pupils equal round and reactive to light.  Facial tone is symmetric.    Limited ROM of lumbar spine with pain on flexion Right sided lower posterior lumbar tenderness.   No abnormal lesions on exposed skin.   Strength: Side Biceps Triceps Deltoid Interossei Grip Wrist Ext. Wrist Flex.  R 5 5 5 5 5 5 5   L 5 5 5 5 5 5 5    Side Iliopsoas Quads Hamstring PF DF EHL  R 5 5 5 5 5 5   L 5 5 5 5 5 5    Reflexes are 2+ and symmetric at the biceps, triceps, brachioradialis, patella and achilles.   Hoffman's is absent.  Clonus is not present.   Bilateral upper and lower extremity sensation is intact to light touch.     Gait is normal.    Medical Decision Making  Imaging: No recent lumbar imaging.   Assessment and Plan: Ms. Higgins is a pleasant 52 y.o. female has 8-10 month history of more constant LBP with intermittent right lateral/posterior leg pain to her foot that is worse with stairs. She has numbness, tingling, and weakness in right leg. She is not sleeping due to pain.   No lumbar imaging has been done. Discussed that LBP and right leg pain is likely lumbar mediated. Left knee pain is likely knee mediated- she has a history of OA.   Treatment options discussed with patient and following plan made:   - Prescription for mobic. Reviewed dosing and side effects. Take with food. Stop OTC motrin/aleve.  - Prescription for robaxin to take prn muscle spasms. Reviewed dosing and side effects. Discussed this can cause drowsiness.  - MRI of lumbar spine to further evaluate lumbar  radiculopathy. No improvement with time, medications, or chiropractic care.  - Will schedule phone visit to review MRI results once I get them back. May consider PT and/or injections at that time.   I spent a total of 25 minutes in face-to-face and non-face-to-face activities related to this patient's care today including review of outside records, review of imaging, review of symptoms, physical exam, discussion of differential diagnosis, discussion of treatment options, and documentation.   Thank you for involving me in the care of this patient.   Drake Leach PA-C Dept. of Neurosurgery

## 2022-05-14 ENCOUNTER — Encounter: Payer: Self-pay | Admitting: Orthopedic Surgery

## 2022-05-14 ENCOUNTER — Ambulatory Visit: Payer: 59 | Admitting: Orthopedic Surgery

## 2022-05-14 VITALS — BP 124/71 | Ht 69.0 in | Wt 230.8 lb

## 2022-05-14 DIAGNOSIS — M5416 Radiculopathy, lumbar region: Secondary | ICD-10-CM

## 2022-05-14 DIAGNOSIS — M5441 Lumbago with sciatica, right side: Secondary | ICD-10-CM | POA: Diagnosis not present

## 2022-05-14 DIAGNOSIS — M25562 Pain in left knee: Secondary | ICD-10-CM | POA: Diagnosis not present

## 2022-05-14 MED ORDER — METHOCARBAMOL 500 MG PO TABS: 500.0000 mg | ORAL_TABLET | Freq: Three times a day (TID) | ORAL | 0 refills | Status: DC | PRN

## 2022-05-14 MED ORDER — MELOXICAM 15 MG PO TABS: 15.0000 mg | ORAL_TABLET | Freq: Every day | ORAL | 2 refills | Status: DC

## 2022-05-14 NOTE — Patient Instructions (Signed)
It was so nice to see you today. Thank you so much for coming in.    I think your back pain and right leg pain is coming from your back. Your left knee pain is likely from the knee.   I want to get an MRI of your lower back to look into things further. We will get this approved through your insurance and [facility] will call you to schedule the appointment.   I sent a prescription for meloxicam to help with pain and inflammation. Take as directed with food. No over the counter motrin or aleve when on this medication.   I also sent a prescription for methocarbamol to help with muscle spasms. Use only as needed and be careful, this can make you sleepy.   Once I have the MRI results, we will call you to set up a phone visit with me to review them.   Please do not hesitate to call if you have any questions or concerns. You can also message me in MyChart.   Drake Leach PA-C [phone]

## 2022-05-19 DIAGNOSIS — M5441 Lumbago with sciatica, right side: Secondary | ICD-10-CM

## 2022-05-19 DIAGNOSIS — M5416 Radiculopathy, lumbar region: Secondary | ICD-10-CM

## 2022-05-19 DIAGNOSIS — M25569 Pain in unspecified knee: Secondary | ICD-10-CM

## 2022-05-19 NOTE — Addendum Note (Signed)
Addended byDrake Leach on: 05/19/2022 04:17 PM   Modules accepted: Orders

## 2022-05-19 NOTE — Telephone Encounter (Unsigned)
Did you call the patient?

## 2022-05-19 NOTE — Telephone Encounter (Signed)
Pt called regarding her mychart messages about medication, MRI, and PT

## 2022-05-22 DIAGNOSIS — M5416 Radiculopathy, lumbar region: Secondary | ICD-10-CM | POA: Diagnosis not present

## 2022-05-22 DIAGNOSIS — M25561 Pain in right knee: Secondary | ICD-10-CM | POA: Diagnosis not present

## 2022-05-23 DIAGNOSIS — M5416 Radiculopathy, lumbar region: Secondary | ICD-10-CM | POA: Diagnosis not present

## 2022-05-23 DIAGNOSIS — M25561 Pain in right knee: Secondary | ICD-10-CM | POA: Diagnosis not present

## 2022-05-30 ENCOUNTER — Telehealth: Payer: Self-pay | Admitting: Orthopedic Surgery

## 2022-05-30 DIAGNOSIS — M1711 Unilateral primary osteoarthritis, right knee: Secondary | ICD-10-CM | POA: Diagnosis not present

## 2022-05-30 DIAGNOSIS — M25561 Pain in right knee: Secondary | ICD-10-CM | POA: Diagnosis not present

## 2022-05-30 MED ORDER — CYCLOBENZAPRINE HCL 10 MG PO TABS: 10.0000 mg | ORAL_TABLET | Freq: Three times a day (TID) | ORAL | 0 refills | Status: DC | PRN

## 2022-05-30 NOTE — Telephone Encounter (Signed)
Looks like she saw Martha Clan for her right knee on 5/17. He gave her a dose pack and said if no better to consider knee injection.   If she is having pain in right knee/leg then she should call him.   I don't see PT discharge note from [facility]. Patty, can we please get this? Once we have that, we can resubmit MRI scan.   As for her medications, she was on mobic and robaxin. If this is not helping, we can try changing to diclofenac and cyclobenzaprine.   Let me know if I need to call these in.

## 2022-05-30 NOTE — Addendum Note (Signed)
Addended byDrake Leach on: 05/30/2022 04:17 PM   Modules accepted: Orders

## 2022-05-30 NOTE — Telephone Encounter (Signed)
She had the knee injection today, but was told they think the pain is coming from her sciatica. So the injection might not help mush. She will wait and see.  She called [facility] right after she called [place] and requested the office note to be faxed over here. No note has been received yet. She is going to call them again. The doctor told her to stop meloxicam while she took the steroid taper, which she did. She finished the steroid taper yesterday and has started back on meloxicam today. She is not taking the muscle relaxer because she has side effects like not able to sleep.

## 2022-05-30 NOTE — Telephone Encounter (Signed)
Patient is calling that she went to PT at [facility] today and they have discharged her already. She will make sure that we get a copy of the office visit. She is complaining of right leg pain. The therapist told her it's not her knee, it might be her right sciatica or back causing the pain. She has finished a steroid pac and the pain is worse. What should she do for the pain? Will the office try to submit authorization for the MRI again?

## 2022-05-30 NOTE — Telephone Encounter (Signed)
Does she want to try another muscle relaxer to see if side effects are better?   In general, most muscle relaxers make people sleepy.   If she wants we can try flexeril. Let me know.

## 2022-05-30 NOTE — Telephone Encounter (Signed)
Addressed in mychart message

## 2022-06-04 NOTE — Telephone Encounter (Signed)
Can we try to get her PT notes from [facility]?

## 2022-06-05 NOTE — Telephone Encounter (Signed)
[facility] notes were scanned into her chart yesterday, with the date of 05/30/2022.

## 2022-06-12 NOTE — Telephone Encounter (Signed)
Please resubmit her lumbar MRI. Discharge note from PT is scanned under media.   Let me know if you need a new order.

## 2022-07-03 DIAGNOSIS — D485 Neoplasm of uncertain behavior of skin: Secondary | ICD-10-CM | POA: Diagnosis not present

## 2022-07-03 DIAGNOSIS — Z86018 Personal history of other benign neoplasm: Secondary | ICD-10-CM | POA: Diagnosis not present

## 2022-07-03 DIAGNOSIS — D225 Melanocytic nevi of trunk: Secondary | ICD-10-CM | POA: Diagnosis not present

## 2022-07-03 DIAGNOSIS — Z859 Personal history of malignant neoplasm, unspecified: Secondary | ICD-10-CM | POA: Diagnosis not present

## 2022-07-03 DIAGNOSIS — Z85820 Personal history of malignant melanoma of skin: Secondary | ICD-10-CM | POA: Diagnosis not present

## 2022-07-03 DIAGNOSIS — Z85828 Personal history of other malignant neoplasm of skin: Secondary | ICD-10-CM | POA: Diagnosis not present

## 2022-07-03 DIAGNOSIS — L308 Other specified dermatitis: Secondary | ICD-10-CM | POA: Diagnosis not present

## 2022-07-03 DIAGNOSIS — Z872 Personal history of diseases of the skin and subcutaneous tissue: Secondary | ICD-10-CM | POA: Diagnosis not present

## 2022-07-03 DIAGNOSIS — L578 Other skin changes due to chronic exposure to nonionizing radiation: Secondary | ICD-10-CM | POA: Diagnosis not present

## 2022-07-15 ENCOUNTER — Other Ambulatory Visit: Payer: Self-pay | Admitting: Obstetrics

## 2022-07-15 DIAGNOSIS — Z1231 Encounter for screening mammogram for malignant neoplasm of breast: Secondary | ICD-10-CM

## 2022-07-21 ENCOUNTER — Encounter: Payer: Self-pay | Admitting: Obstetrics

## 2022-07-21 ENCOUNTER — Ambulatory Visit (INDEPENDENT_AMBULATORY_CARE_PROVIDER_SITE_OTHER): Payer: 59 | Admitting: Obstetrics

## 2022-07-21 VITALS — BP 134/86 | HR 84 | Ht 69.0 in | Wt 229.6 lb

## 2022-07-21 DIAGNOSIS — Z01419 Encounter for gynecological examination (general) (routine) without abnormal findings: Secondary | ICD-10-CM

## 2022-07-21 DIAGNOSIS — Z1211 Encounter for screening for malignant neoplasm of colon: Secondary | ICD-10-CM

## 2022-07-21 NOTE — Progress Notes (Signed)
Gynecology Annual Exam  PCP: Mirna Mires, CNM  Chief Complaint:  Chief Complaint  Patient presents with   Annual Exam    History of Present Illness: Patient is a 52 y.o. G1P1001 presents for annual exam. The patient has no complaints today. She does report an episode of brief, light spotting several months ago. She has had an endometrial ablation in the past. Her husband had a vasectomy previously. She saw Dr. Marice Potter  and had some testing done to establish perimenopause vs menopause. Lakeya is married. She has grown children. She is sexually active. 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  LMP: No LMP recorded. Patient has had an ablation.   The patient is sexually active. She currently uses tubal ligation for contraception. She denies dyspareunia.  The patient does perform self breast exams.  There is no notable family history of breast or ovarian cancer in her family.  The patient wears seatbelts: yes.   The patient has regular exercise: no.    The patient denies current symptoms of depression.    Review of Systems: Review of Systems  Constitutional: Negative.   HENT: Negative.    Eyes: Negative.   Respiratory: Negative.    Cardiovascular: Negative.   Gastrointestinal: Negative.   Genitourinary: Negative.   Musculoskeletal: Negative.   Skin: Negative.   Neurological: Negative.   Endo/Heme/Allergies: Negative.   Psychiatric/Behavioral: Negative.      Past Medical History:  Patient Active Problem List   Diagnosis Date Noted Date Diagnosed   Postmenopausal vaginal bleeding 07/31/2021    History of endometrial ablation 07/15/2021    Mechanical lagophthalmos, bilateral, upper and lower eyelids 11/03/2018    Hypertropia of right eye 11/20/2017     Formatting of this note might be different from the original. 11/20/2017 RHT fairly comitant in lateral gazes, minimal in DG CT sinus reviewed. LIR muscle much larger than RIR. Recommend  asymmetric IR recession OS>OD Could also consider RSR recession FDT  01/20/2018 Plan for LIR recess (prev recessed 4mm) Mersilene Adjustable  Last Assessment & Plan:  Formatting of this note might be different from the original. 11/20/2017 RHT fairly comitant in lateral gazes, minimal in DG CT sinus reviewed. LIR muscle much larger than RIR. 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 Chin up    Visual confusion 08/18/2016    Hypertriglyceridemia, essential 06/20/2016    Menorrhagia 04/03/2016    Thyroid eye disease 04/03/2016    Graves disease 04/03/2016     Had radioactive ablation of thyroid    Vaccine counseling 12/25/2015    Frozen shoulder syndrome 10/09/2015     Injected 10/09/2015 Repeat injection given 12/10/2015.    Other specified postprocedural states 06/01/2014     Formatting of this note might be different from the original. 03/18/2018 Looks very good No diplopia in PP and downgaze She tolerates some prism in primary position so there is room to correct her right hypertropia in side gazes Patient would like to get new glasses but right now is too soon Will have her return in 4-6 weeks and re-refract at that time  Last Assessment & Plan:  Formatting of this note might be different from  the original. 03/18/2018 Looks very good No diplopia in PP and downgaze She tolerates some prism in primary position so there is room to correct her right hypertropia in side gazes Patient would like to get new glasses but right now is too soon Will have her return in 4-6 weeks and re-refract at that time    Monocular esotropia of right eye 02/08/2014     Formatting of this  note might be different from the original. 11/20/2017 ET fairly comitant Recommend BMR recess vs. BLR resect, Forced duction testing CT sinus reviewed - significant asymmetry MR much larger than LR  01/20/2018 Target ET 18-20  FDT. MR already recessed 5-5.72mm. May need LR resect    Last Assessment & Plan:  Formatting of this note might be different from the original. 11/20/2017 ET fairly comitant Recommend BMR recess vs. BLR resect, Forced duction testing CT sinus reviewed - significant asymmetry MR much larger than LR  01/20/2018 Target ET 18-20  FDT. MR already recessed 5-5.15mm. May need LR resect    Diplopia 02/01/2014     Formatting of this note might be different from the original. 05/05/2018 Resolved   11/03/2018 Intermittent  Only present in downgaze when she is tired  Small angle intermittent RHT 1 PD .  2 PD intermittent ET in RG  Recommend to observe   Last Assessment & Plan:  Formatting of this note might be different from the original. 11/03/2018 Intermittent  Only present in downgaze when she is tired  Small angle intermittent RHT 1 PD .  2 PD intermittent ET in RG  Recommend to observe    Essential hypertension with goal blood pressure less than 140/90 11/23/2013    Non morbid obesity due to excess calories 11/23/2013    Pre-diabetes 11/23/2013    Vitamin D deficiency 02/15/2013     Vit d level was 21 in feb    Bursitis of knee 02/06/2013    Visit for preventive health examination 02/04/2013    Obesity (BMI 30.0-34.9) 02/01/2012     Prescribed appetite suppressant by dr. Tedd Sias.     Hypothyroidism (acquired) 02/01/2012     Secondary to radioactive iodine treatment for hyperthyroidism.     Past Surgical History:  Past Surgical History:  Procedure Laterality Date   CARPAL TUNNEL RELEASE Right 11/01/2020   Procedure: RIGHT CARPAL TUNNEL RELEASE;  Surgeon: Juanell Fairly, MD;  Location: [facility] ORS;  Service: Orthopedics;  Laterality: Right;    ENDOMETRIAL ABLATION  05/07/2017   Dr Tiburcio Pea   ENDOMETRIAL BIOPSY  2012   proliferative with focal breakdown   EYE SURGERY     multiple eye decompression surgeries from 2015 till present   radioactive iodine ablation     Graves disease   STRABISMUS SURGERY  03/04/2018    Gynecologic History:  No LMP recorded. Patient has had an ablation. Contraception: vasectomy Last Pap: Results were: NILM no abnormalities  Last mammogram: set fro 08/08/2022 Results were: BI-RAD I  Obstetric History: G1P1001  Family History:  Family History  Problem Relation Age of Onset   Thyroid disease Father    Hypertension Father    Skin cancer Father    Dementia Maternal Grandmother        dementia   Breast cancer Maternal Grandmother 54   Stroke Paternal Grandfather    Glaucoma Mother    Hypertension Mother    Breast cancer Paternal Aunt 37   Cancer Cousin 12    Social History:  Social History   Socioeconomic History  Marital status: Married    Spouse name: Not on file   Number of children: 1   Years of education: Not on file   Highest education level: Not on file  Occupational History   Occupation: Child psychotherapist  Tobacco Use   Smoking status: Never   Smokeless tobacco: Never  Vaping Use   Vaping status: Never Used  Substance and Sexual Activity   Alcohol use: No   Drug use: No   Sexual activity: Yes    Partners: Male    Birth control/protection: Condom, Surgical    Comment: Vasectomy  Other Topics Concern   Not on file  Social History Narrative   Not on file   Social Determinants of Health   Financial Resource Strain: Not on file  Food Insecurity: Not on file  Transportation Needs: Not on file  Physical Activity: Sufficiently Active (04/02/2017)   Exercise Vital Sign    Days of Exercise per Week: 4 days    Minutes of Exercise per Session: 60 min  Stress: No Stress Concern Present (04/02/2017)   [employer] of Occupational Health - Occupational Stress Questionnaire     Feeling of Stress : Not at all  Social Connections: Not on file  Intimate Partner Violence: Not on file    Allergies:  No Known Allergies  Medications: Prior to Admission medications   Medication Sig Start Date End Date Taking? Authorizing Provider  Berberine Chloride 500 MG CAPS Take by mouth.   Yes [provider]  Cholecalciferol (VITAMIN D3) 125 MCG (5000 UT) CAPS Take 5,000 Units by mouth daily.   Yes [provider]  Cyanocobalamin (VITAMIN B-12) 5000 MCG SUBL Place 5,000 mcg under the tongue 3 (three) times a week.   Yes [provider]  cyclobenzaprine (FLEXERIL) 10 MG tablet Take 1 tablet (10 mg total) by mouth 3 (three) times daily as needed for muscle spasms. This can make you sleepy. 05/30/22  Yes Drake Leach, PA-C  levOCARNitine (L-CARNITINE) 250 MG CAPS Take 2 capsules by mouth daily.   Yes [provider]  MAGNESIUM OXIDE 400 PO Take 400 mg by mouth daily.   Yes [provider]  meloxicam (MOBIC) 15 MG tablet Take 1 tablet (15 mg total) by mouth daily. Take with food. 05/14/22 05/14/23 Yes Drake Leach, PA-C  Misc Natural Products (ELDERBERRY IMMUNE COMPLEX) CHEW Chew 2 each by mouth daily.   Yes [provider]  NP THYROID 30 MG tablet Take 60 mg by mouth in the morning and at bedtime. 03/30/17  Yes [provider]  phentermine (ADIPEX-P) 37.5 MG tablet Take 37.5 mg by mouth daily before breakfast. 03/17/13  Yes [provider]  Probiotic Product (PROBIOTIC DAILY PO) Take by mouth.   Yes [provider]    Physical Exam Vitals: Blood pressure 134/86, pulse 84, height 5\' 9"  (1.753 m), weight 229 lb 9.6 oz (104.1 kg).  General: NAD HEENT: normocephalic, anicteric Thyroid: no enlargement, no palpable nodules Pulmonary: No increased work of breathing, CTAB Cardiovascular: RRR, distal pulses 2+ Breast: Breast symmetrical, no tenderness, no palpable nodules or masses, no skin or nipple retraction  present, no nipple discharge.  No axillary or supraclavicular lymphadenopathy. Abdomen: NABS, soft, non-tender, non-distended.  Umbilicus without lesions.  No hepatomegaly, splenomegaly or masses palpable. No evidence of hernia  Genitourinary:  External: Normal external female genitalia.  Normal urethral meatus, normal Bartholin's and Skene's glands.    Vagina: Normal vaginal mucosa, no evidence of prolapse.    Cervix: Grossly normal  in appearance, no bleeding  Uterus: Non-enlarged, mobile, normal contour.  No CMT  Adnexa: ovaries non-enlarged, no adnexal masses  Rectal: deferred  Lymphatic: no evidence of inguinal lymphadenopathy Extremities: no edema, erythema, or tenderness Neurologic: Grossly intact Psychiatric: mood appropriate, affect full  Female chaperone present for pelvic and breast  portions of the physical exam    Assessment: 52 y.o. G1P1001 routine annual exam  Plan: Problem List Items Addressed This Visit   None Visit Diagnoses     Encounter for screening colonoscopy    -  Primary   Relevant Orders   Ambulatory referral to Gastroenterology   Women's annual routine gynecological examination           1) Mammogram - recommend yearly screening mammogram.  Mammogram  date is already scheduled for this year.   2) STI screening  wasoffered and declined  3) ASCCP guidelines and rational discussed.  Patient opts for every 5 years screening interval  4) Contraception - the patient is currently using  vasectomy.  She is happy with her current form of contraception and plans to continue  5) Colonoscopy -- Screening recommended starting at age 41 for average risk individuals, age 9 for individuals deemed at increased risk (including African Americans) and recommended to continue until age 3.  For patient age 58-85 individualized approach is recommended.  Gold standard screening is via colonoscopy, Cologuard screening is an acceptable alternative for patient unwilling or  unable to undergo colonoscopy.  "Colorectal cancer screening for average?risk adults: 2018 guideline update from the American Cancer Society"CA: A Cancer Journal for Clinicians: Jun 04, 2016   I have put in a referral to local GI specialists to set her up for a colonoscopy.  6) Routine healthcare maintenance including cholesterol, diabetes screening discussed managed by PCP  7) No follow-ups on file.   Mirna Mires, CNM  07/21/2022 4:54 PM   07/21/2022, 4:54 PM

## 2022-07-24 ENCOUNTER — Telehealth: Payer: Self-pay

## 2022-07-24 ENCOUNTER — Other Ambulatory Visit: Payer: Self-pay

## 2022-07-24 DIAGNOSIS — Z1211 Encounter for screening for malignant neoplasm of colon: Secondary | ICD-10-CM

## 2022-07-24 MED ORDER — NA SULFATE-K SULFATE-MG SULF 17.5-3.13-1.6 GM/177ML PO SOLN: 1.0000 | Freq: Once | ORAL | 0 refills | Status: AC

## 2022-07-24 NOTE — Telephone Encounter (Signed)
Patient was advised to stop Phentermine 1 day before colonoscopy on 10/02/22.    Instructions corrected to note this.  Thanks,  [place], [place]

## 2022-07-24 NOTE — Telephone Encounter (Signed)
Gastroenterology Pre-Procedure Review  Request Date: 10/03/22 Requesting Physician: Dr. Servando Snare  PATIENT REVIEW QUESTIONS: The patient responded to the following health history questions as indicated:    1. Are you having any GI issues? no 2. Do you have a personal history of Polyps? no 3. Do you have a family history of Colon Cancer or Polyps? no 4. Diabetes Mellitus? no 5. Joint replacements in the past 12 months?no 6. Major health problems in the past 3 months?no 7. Any artificial heart valves, MVP, or defibrillator?no    MEDICATIONS & ALLERGIES:    Patient reports the following regarding taking any anticoagulation/antiplatelet therapy:   Plavix, Coumadin, Eliquis, Xarelto, Lovenox, Pradaxa, Brilinta, or Effient? no Aspirin? no  Patient confirms/reports the following medications:  Current Outpatient Medications  Medication Sig Dispense Refill   Berberine Chloride 500 MG CAPS Take by mouth.     Cholecalciferol (VITAMIN D3) 125 MCG (5000 UT) CAPS Take 5,000 Units by mouth daily.     Cyanocobalamin (VITAMIN B-12) 5000 MCG SUBL Place 5,000 mcg under the tongue 3 (three) times a week.     cyclobenzaprine (FLEXERIL) 10 MG tablet Take 1 tablet (10 mg total) by mouth 3 (three) times daily as needed for muscle spasms. This can make you sleepy. 60 tablet 0   levOCARNitine (L-CARNITINE) 250 MG CAPS Take 2 capsules by mouth daily.     MAGNESIUM OXIDE 400 PO Take 400 mg by mouth daily.     meloxicam (MOBIC) 15 MG tablet Take 1 tablet (15 mg total) by mouth daily. Take with food. 30 tablet 2   Misc Natural Products (ELDERBERRY IMMUNE COMPLEX) CHEW Chew 2 each by mouth daily.     NP THYROID 30 MG tablet Take 60 mg by mouth in the morning and at bedtime.     phentermine (ADIPEX-P) 37.5 MG tablet Take 37.5 mg by mouth daily before breakfast.     Probiotic Product (PROBIOTIC DAILY PO) Take by mouth.     No current facility-administered medications for this visit.    Patient confirms/reports the  following allergies:  No Known Allergies  No orders of the defined types were placed in this encounter.   AUTHORIZATION INFORMATION Primary Insurance: 1D#: Group #:  Secondary Insurance: 1D#: Group #:  SCHEDULE INFORMATION: Date: 10/03/22 Time: Location: [facility]

## 2022-07-24 NOTE — Telephone Encounter (Signed)
PT left vmm to schedule colonoscopy please return call 

## 2022-07-25 ENCOUNTER — Encounter: Payer: Self-pay | Admitting: Orthopedic Surgery

## 2022-07-28 ENCOUNTER — Inpatient Hospital Stay: Admission: RE | Admit: 2022-07-28 | Payer: 59 | Source: Ambulatory Visit

## 2022-07-28 ENCOUNTER — Ambulatory Visit
Admission: RE | Admit: 2022-07-28 | Discharge: 2022-07-28 | Disposition: A | Discharge status: Home / Self Care | Payer: 59 | Source: Ambulatory Visit | Attending: Orthopedic Surgery | Admitting: Orthopedic Surgery

## 2022-07-28 DIAGNOSIS — M5416 Radiculopathy, lumbar region: Secondary | ICD-10-CM

## 2022-07-28 DIAGNOSIS — M48061 Spinal stenosis, lumbar region without neurogenic claudication: Secondary | ICD-10-CM | POA: Diagnosis not present

## 2022-07-28 DIAGNOSIS — M5441 Lumbago with sciatica, right side: Secondary | ICD-10-CM

## 2022-07-28 DIAGNOSIS — M4316 Spondylolisthesis, lumbar region: Secondary | ICD-10-CM | POA: Diagnosis not present

## 2022-07-28 DIAGNOSIS — M47816 Spondylosis without myelopathy or radiculopathy, lumbar region: Secondary | ICD-10-CM | POA: Diagnosis not present

## 2022-08-08 ENCOUNTER — Ambulatory Visit
Admission: RE | Admit: 2022-08-08 | Discharge: 2022-08-08 | Disposition: A | Discharge status: Home / Self Care | Payer: 59 | Source: Ambulatory Visit | Attending: Obstetrics | Admitting: Obstetrics

## 2022-08-08 DIAGNOSIS — Z1231 Encounter for screening mammogram for malignant neoplasm of breast: Secondary | ICD-10-CM | POA: Diagnosis not present

## 2022-08-11 DIAGNOSIS — D225 Melanocytic nevi of trunk: Secondary | ICD-10-CM | POA: Diagnosis not present

## 2022-08-11 DIAGNOSIS — D235 Other benign neoplasm of skin of trunk: Secondary | ICD-10-CM | POA: Diagnosis not present

## 2022-08-18 NOTE — Progress Notes (Unsigned)
Referring Physician:  Mirna Mires, CNM [address] [place],  [place] [address]  Primary Physician:  Mirna Mires, CNM  History of Present Illness: 08/19/2022 Ms. Quetzal Lagle has a history of HTN, hypothyroidism, graves disease, obesity, hypercholesterolemia.   Last seen by me on 05/14/22 for back and right leg pain. She was given mobic and robaxin. MRI ordered but denied by insurance.   She did one visit of PT on 05/22/22 at [facility] and was discharged due to increased back and knee pain. She sees Dr. Martha Clan for her knee.   She is no longer taking flexeril. She is still taking mobic.   She had lumbar MRI done and is here to review the results.   Her primary complaint is constant LBP. No current right leg pain. No numbness, tingling, or weakness. She has bilateral knee pain, but no radicular leg pain.   As above, she is taking mobic. She thinks muscle relaxers caused headaches.   Bowel/Bladder Dysfunction: none  Conservative measures:  Physical therapy: one visit on 05/22/22 at [facility] and was discharged.  Multimodal medical therapy including regular antiinflammatories: tylenol, advil, aleve, oxycodone, prednisone, mobic, flexeril, robaxin Injections: No epidural steroid injections  Past Surgery: No spinal surgery  KAISEY INCLAN has no symptoms of cervical myelopathy.  The symptoms are causing a significant impact on the patient's life.   Review of Systems:  A 10 point review of systems is negative, except for the pertinent positives and negatives detailed in the HPI.  Past Medical History: Past Medical History:  Diagnosis Date   Contraception management 02/01/2012   Depression    Graves disease    Hypertension    Hypothyroidism    following iodine ablation   Melanoma (HCC)    Menorrhagia    Obesity    PONV (postoperative nausea and vomiting)    Pre-diabetes    Thyroid eye disease     Past Surgical History: Past Surgical History:   Procedure Laterality Date   CARPAL TUNNEL RELEASE Right 11/01/2020   Procedure: RIGHT CARPAL TUNNEL RELEASE;  Surgeon: Juanell Fairly, MD;  Location: [facility] ORS;  Service: Orthopedics;  Laterality: Right;   ENDOMETRIAL ABLATION  05/07/2017   Dr Tiburcio Pea   ENDOMETRIAL BIOPSY  2012   proliferative with focal breakdown   EYE SURGERY     multiple eye decompression surgeries from 2015 till present   radioactive iodine ablation     Graves disease   STRABISMUS SURGERY  03/04/2018    Allergies: Allergies as of 08/19/2022   (No Known Allergies)    Medications: Outpatient Encounter Medications as of 08/19/2022  Medication Sig   Berberine Chloride 500 MG CAPS Take by mouth.   Cholecalciferol (VITAMIN D3) 125 MCG (5000 UT) CAPS Take 5,000 Units by mouth daily.   Cyanocobalamin (VITAMIN B-12) 5000 MCG SUBL Place 5,000 mcg under the tongue 3 (three) times a week.   MAGNESIUM OXIDE 400 PO Take 400 mg by mouth daily.   meloxicam (MOBIC) 15 MG tablet Take 1 tablet (15 mg total) by mouth daily. Take with food.   Misc Natural Products (ELDERBERRY IMMUNE COMPLEX) CHEW Chew 2 each by mouth daily.   NP THYROID 30 MG tablet Take 60 mg by mouth in the morning and at bedtime.   phentermine (ADIPEX-P) 37.5 MG tablet Take 37.5 mg by mouth daily before breakfast.   Probiotic Product (PROBIOTIC DAILY PO) Take by mouth.   [DISCONTINUED] cyclobenzaprine (FLEXERIL) 10 MG tablet Take 1 tablet (10 mg total) by  mouth 3 (three) times daily as needed for muscle spasms. This can make you sleepy. (Patient not taking: Reported on 08/19/2022)   [DISCONTINUED] levOCARNitine (L-CARNITINE) 250 MG CAPS Take 2 capsules by mouth daily. (Patient not taking: Reported on 08/19/2022)   No facility-administered encounter medications on file as of 08/19/2022.    Social History: Social History   Tobacco Use   Smoking status: Never   Smokeless tobacco: Never  Vaping Use   Vaping status: Never Used  Substance Use Topics   Alcohol  use: No   Drug use: No    Family Medical History: Family History  Problem Relation Age of Onset   Thyroid disease Father    Hypertension Father    Skin cancer Father    Dementia Maternal Grandmother        dementia   Breast cancer Maternal Grandmother 95   Stroke Paternal Grandfather    Glaucoma Mother    Hypertension Mother    Breast cancer Paternal Aunt 81   Cancer Cousin 12    Physical Examination: Vitals:   08/19/22 1435  BP: 130/78      Awake, alert, oriented to person, place, and time.  Speech is clear and fluent. Fund of knowledge is appropriate.   Cranial Nerves: Pupils equal round and reactive to light.  Facial tone is symmetric.    She has tenderness over right SI joint.   No abnormal lesions on exposed skin.   Strength: Side Biceps Triceps Deltoid Interossei Grip Wrist Ext. Wrist Flex.  R 5 5 5 5 5 5 5   L 5 5 5 5 5 5 5    Side Iliopsoas Quads Hamstring PF DF EHL  R 5 5 5 5 5 5   L 5 5 5 5 5 5    Reflexes are 2+ and symmetric at the biceps, triceps, brachioradialis, patella and achilles.   Hoffman's is absent.  Clonus is not present.   Bilateral upper and lower extremity sensation is intact to light touch.     Gait is normal.    Medical Decision Making  Imaging: Lumbar MRI dated 07/28/22:  FINDINGS: Segmentation:  Standard.   Alignment:  Trace retrolisthesis of L2 on L3.   Vertebrae: No fracture, evidence of discitis, or bone lesion. There are degenerative endplate changes at L2-L3 with near-complete disc space loss and bone-on-bone articulation.   Conus medullaris and cauda equina: Conus extends to the L1-L2 disc space level. Conus and cauda equina appear normal.   Paraspinal and other soft tissues: Negative.   Disc levels:   T12-L1: Unremarkable   L1-L2: Mild bilateral facet degenerative change. No spinal canal or neural foraminal stenosis.   L2-L3: Moderate bilateral facet degenerative change with fluid in the facet joints. Mild  spinal canal narrowing secondary to combination retrolisthesis and minimal disc bulge. Mild bilateral neural foraminal narrowing.   L3-L4: Moderate bilateral facet degenerative change with ligamentum flavum hypertrophy. Mild spinal canal narrowing. Mild moderate bilateral neural foraminal narrowing.   L4-L5: Moderate to severe bilateral facet degenerative change. Minimal disc bulge. Mild spinal canal narrowing. Mild bilateral neural foraminal narrowing.   L5-S1: Moderate bilateral facet degenerative change with ligamentum flavum hypertrophy. No significant disc bulge. No spinal canal narrowing. Neural foraminal narrowing.   IMPRESSION: 1. Multilevel degenerative changes of the lumbar spine, most pronounced at L2-L3 where there is near-complete disc space loss and bone-on-bone articulation. 2. Mild spinal canal narrowing at L2-L3, L3-L4, and L4-L5. 3. Mild-to-moderate bilateral neural foraminal narrowing at L3-L4.     Electronically  Signed   By: Lorenza Cambridge M.D.   On: 08/07/2022 09:30   I have personally reviewed the images and agree with the above interpretation.   Assessment and Plan: Ms. Holderbaum is a pleasant 52 y.o. female continues with constant LBP that is more right sided. No current right leg pain. No numbness, tingling, or weakness. She has bilateral knee pain, but no radicular leg pain.   She has known severe DDD L2-L3 with mild bilateral foraminal stenosis. She has multilevel mild central stenosis L2-L5. She has mild bilateral foraminal stenosis L4-L5 along with diffuse lumbar spondylosis.   LBP is likely from severe DDD L2-L3 and underlying spondylosis. She  may have component of pain from right SI joint as well.   Treatment options discussed with patient and following plan made:   - Referral to PMR at [facility] to discuss lumbar versus right SI joint injection.  - Continue on mobic. Reviewed dosing and side effects. Take with food. Do not take motrin with mobic. Message  me if she wants to try another NSAID (voltaren, naproxen, relafen).   - Discussed revisiting PT for lumbar spine (she was discharged). She declines.  - Son is getting married in September, will let me know if she needs medrol dose pack.  - Will message her in 4-6 weeks to check on her.   I spent a total of 20 minutes in face-to-face and non-face-to-face activities related to this patient's care today including review of outside records, review of imaging, review of symptoms, physical exam, discussion of differential diagnosis, discussion of treatment options, and documentation.   Drake Leach PA-C Dept. of Neurosurgery

## 2022-08-19 ENCOUNTER — Encounter: Payer: Self-pay | Admitting: Orthopedic Surgery

## 2022-08-19 ENCOUNTER — Ambulatory Visit (INDEPENDENT_AMBULATORY_CARE_PROVIDER_SITE_OTHER): Payer: 59 | Admitting: Orthopedic Surgery

## 2022-08-19 VITALS — BP 130/78 | Ht 69.0 in | Wt 229.6 lb

## 2022-08-19 DIAGNOSIS — M4310 Spondylolisthesis, site unspecified: Secondary | ICD-10-CM | POA: Diagnosis not present

## 2022-08-19 DIAGNOSIS — M48061 Spinal stenosis, lumbar region without neurogenic claudication: Secondary | ICD-10-CM | POA: Diagnosis not present

## 2022-08-19 DIAGNOSIS — M47816 Spondylosis without myelopathy or radiculopathy, lumbar region: Secondary | ICD-10-CM

## 2022-08-19 DIAGNOSIS — M5186 Other intervertebral disc disorders, lumbar region: Secondary | ICD-10-CM

## 2022-08-19 DIAGNOSIS — M533 Sacrococcygeal disorders, not elsewhere classified: Secondary | ICD-10-CM

## 2022-08-19 NOTE — Patient Instructions (Signed)
It was so nice to see you today. Thank you so much for coming in.    You have wear and tear in your back with severe degeneration of the disc at L2-L3. Some of the right sided lower back pain may be from your SI joint as well.   Continue on meloxicam to help with pain and inflammation. Take as directed with food. Let me know if you want to try another medication.   I want you to see physical medicine and rehab at the [facility] to discuss possible injections in your lower back and/or SI joint. Dr. Yves Dill, Dr. Mariah Milling, and their PA Alphonzo Lemmings are great and will take good care of you. They should call you to schedule an appointment or you can call them at [phone].   I will message you in 4-6 weeks to check on you. Please do not hesitate to call if you have any questions or concerns. You can also message me in MyChart.   Drake Leach PA-C [phone]

## 2022-08-21 ENCOUNTER — Other Ambulatory Visit: Payer: Self-pay | Admitting: Orthopedic Surgery

## 2022-08-21 DIAGNOSIS — M5416 Radiculopathy, lumbar region: Secondary | ICD-10-CM

## 2022-08-21 DIAGNOSIS — M5441 Lumbago with sciatica, right side: Secondary | ICD-10-CM

## 2022-08-21 DIAGNOSIS — M7918 Myalgia, other site: Secondary | ICD-10-CM | POA: Diagnosis not present

## 2022-08-21 DIAGNOSIS — M461 Sacroiliitis, not elsewhere classified: Secondary | ICD-10-CM | POA: Diagnosis not present

## 2022-08-21 NOTE — Telephone Encounter (Signed)
Refill of  mobic okay. Please let her know it was sent to the pharmacy.

## 2022-08-21 NOTE — Telephone Encounter (Signed)
Patient notified of refill.

## 2022-08-27 ENCOUNTER — Ambulatory Visit: Payer: 59 | Admitting: Orthopedic Surgery

## 2022-09-02 DIAGNOSIS — M461 Sacroiliitis, not elsewhere classified: Secondary | ICD-10-CM | POA: Diagnosis not present

## 2022-09-23 ENCOUNTER — Encounter: Payer: Self-pay | Admitting: Gastroenterology

## 2022-09-23 DIAGNOSIS — M13871 Other specified arthritis, right ankle and foot: Secondary | ICD-10-CM | POA: Diagnosis not present

## 2022-09-23 DIAGNOSIS — S93402A Sprain of unspecified ligament of left ankle, initial encounter: Secondary | ICD-10-CM | POA: Diagnosis not present

## 2022-09-25 DIAGNOSIS — M5126 Other intervertebral disc displacement, lumbar region: Secondary | ICD-10-CM | POA: Diagnosis not present

## 2022-09-25 DIAGNOSIS — M5416 Radiculopathy, lumbar region: Secondary | ICD-10-CM | POA: Diagnosis not present

## 2022-09-25 DIAGNOSIS — M461 Sacroiliitis, not elsewhere classified: Secondary | ICD-10-CM | POA: Diagnosis not present

## 2022-09-26 ENCOUNTER — Encounter: Payer: Self-pay | Admitting: Gastroenterology

## 2022-09-26 NOTE — Anesthesia Preprocedure Evaluation (Addendum)
Anesthesia Evaluation  Patient identified by MRN, date of birth, ID band Patient awake    Reviewed: Allergy & Precautions, H&P , NPO status , Patient's Chart, lab work & pertinent test results  History of Anesthesia Complications (+) PONV and history of anesthetic complications  Airway Mallampati: III  TM Distance: >3 FB Neck ROM: Full    Dental no notable dental hx. (+) Caps Crown lower jaw:   Pulmonary neg pulmonary ROS   Pulmonary exam normal breath sounds clear to auscultation       Cardiovascular hypertension, Normal cardiovascular exam Rhythm:Regular Rate:Normal     Neuro/Psych  PSYCHIATRIC DISORDERS  Depression     Neuromuscular disease    GI/Hepatic negative GI ROS, Neg liver ROS,,,  Endo/Other  Hypothyroidism    Renal/GU negative Renal ROS  negative genitourinary   Musculoskeletal  (+) Arthritis ,    Abdominal   Peds negative pediatric ROS (+)  Hematology negative hematology ROS (+)   Anesthesia Other Findings Hypertension  Thyroid eye disease Graves disease  Hypothyroidism Depression  Menorrhagia Obesity  Melanoma (HCC) Contraception management  PONV (postoperative nausea and vomiting) Pre-diabetes  Sciatica of right side Chronic lower back pain  Arthritis Motion sickness Open angle glaucoma      Reproductive/Obstetrics negative OB ROS                             Anesthesia Physical Anesthesia Plan  ASA: 2  Anesthesia Plan: General   Post-op Pain Management:    Induction: Intravenous  PONV Risk Score and Plan:   Airway Management Planned: Natural Airway and Nasal Cannula  Additional Equipment:   Intra-op Plan:   Post-operative Plan:   Informed Consent: I have reviewed the patients History and Physical, chart, labs and discussed the procedure including the risks, benefits and alternatives for the proposed anesthesia with the patient or authorized  representative who has indicated his/her understanding and acceptance.     Dental Advisory Given  Plan Discussed with: Anesthesiologist, CRNA and Surgeon  Anesthesia Plan Comments: (Patient consented for risks of anesthesia including but not limited to:  - adverse reactions to medications - risk of airway placement if required - damage to eyes, teeth, lips or other oral mucosa - nerve damage due to positioning  - sore throat or hoarseness - Damage to heart, brain, nerves, lungs, other parts of body or loss of life  Patient voiced understanding.)       Anesthesia Quick Evaluation

## 2022-10-01 DIAGNOSIS — E0500 Thyrotoxicosis with diffuse goiter without thyrotoxic crisis or storm: Secondary | ICD-10-CM | POA: Diagnosis not present

## 2022-10-01 DIAGNOSIS — H5789 Other specified disorders of eye and adnexa: Secondary | ICD-10-CM | POA: Diagnosis not present

## 2022-10-01 DIAGNOSIS — E079 Disorder of thyroid, unspecified: Secondary | ICD-10-CM | POA: Diagnosis not present

## 2022-10-03 ENCOUNTER — Ambulatory Visit
Admission: RE | Admit: 2022-10-03 | Discharge: 2022-10-03 | Disposition: A | Discharge status: Home / Self Care | Payer: 59 | Attending: Gastroenterology | Admitting: Gastroenterology

## 2022-10-03 ENCOUNTER — Encounter
Admission: RE | Disposition: A | Discharge status: Home / Self Care | Payer: Self-pay | Source: Home / Self Care | Attending: Gastroenterology

## 2022-10-03 ENCOUNTER — Ambulatory Visit: Payer: 59 | Admitting: Anesthesiology

## 2022-10-03 ENCOUNTER — Other Ambulatory Visit: Payer: Self-pay

## 2022-10-03 ENCOUNTER — Encounter: Payer: Self-pay | Admitting: Gastroenterology

## 2022-10-03 DIAGNOSIS — Z1211 Encounter for screening for malignant neoplasm of colon: Secondary | ICD-10-CM

## 2022-10-03 DIAGNOSIS — K640 First degree hemorrhoids: Secondary | ICD-10-CM | POA: Diagnosis not present

## 2022-10-03 DIAGNOSIS — K635 Polyp of colon: Secondary | ICD-10-CM

## 2022-10-03 DIAGNOSIS — D124 Benign neoplasm of descending colon: Secondary | ICD-10-CM

## 2022-10-03 DIAGNOSIS — E039 Hypothyroidism, unspecified: Secondary | ICD-10-CM | POA: Diagnosis not present

## 2022-10-03 DIAGNOSIS — I10 Essential (primary) hypertension: Secondary | ICD-10-CM | POA: Insufficient documentation

## 2022-10-03 HISTORY — DX: Unspecified open-angle glaucoma, stage unspecified: H40.10X0

## 2022-10-03 HISTORY — DX: Other chronic pain: G89.29

## 2022-10-03 HISTORY — DX: Motion sickness, initial encounter: T75.3XXA

## 2022-10-03 HISTORY — DX: Unspecified osteoarthritis, unspecified site: M19.90

## 2022-10-03 HISTORY — PX: COLONOSCOPY WITH PROPOFOL: SHX5780

## 2022-10-03 HISTORY — DX: Sciatica, right side: M54.31

## 2022-10-03 HISTORY — DX: Low back pain, unspecified: M54.50

## 2022-10-03 HISTORY — PX: POLYPECTOMY: SHX5525

## 2022-10-03 SURGERY — COLONOSCOPY WITH PROPOFOL
Anesthesia: General | Site: Rectum

## 2022-10-03 MED ORDER — LIDOCAINE HCL (PF) 2 % IJ SOLN
INTRAMUSCULAR | Status: AC
  Filled 2022-10-03: qty 5

## 2022-10-03 MED ORDER — PROPOFOL 1000 MG/100ML IV EMUL
INTRAVENOUS | Status: AC
  Filled 2022-10-03: qty 100

## 2022-10-03 MED ORDER — STERILE WATER FOR IRRIGATION IR SOLN
Status: DC | PRN
  Administered 2022-10-03: 100 mL

## 2022-10-03 MED ORDER — LIDOCAINE HCL (CARDIAC) PF 100 MG/5ML IV SOSY
PREFILLED_SYRINGE | INTRAVENOUS | Status: DC | PRN
Start: 2022-10-03 — End: 2022-10-03
  Administered 2022-10-03: 40 mg via INTRATRACHEAL

## 2022-10-03 MED ORDER — PROPOFOL 10 MG/ML IV BOLUS
INTRAVENOUS | Status: DC | PRN
Start: 2022-10-03 — End: 2022-10-03
  Administered 2022-10-03 (×2): 20 mg via INTRAVENOUS
  Administered 2022-10-03 (×2): 30 mg via INTRAVENOUS
  Administered 2022-10-03: 50 mg via INTRAVENOUS
  Administered 2022-10-03: 30 mg via INTRAVENOUS
  Administered 2022-10-03: 40 mg via INTRAVENOUS
  Administered 2022-10-03: 20 mg via INTRAVENOUS

## 2022-10-03 MED ORDER — SODIUM CHLORIDE 0.9 % IV SOLN: INTRAVENOUS | Status: DC

## 2022-10-03 MED ORDER — LACTATED RINGERS IV SOLN: INTRAVENOUS | Status: DC

## 2022-10-03 SURGICAL SUPPLY — 8 items
GOWN CVR UNV OPN BCK APRN NK (MISCELLANEOUS) ×4 IMPLANT
GOWN ISOL THUMB LOOP REG UNIV (MISCELLANEOUS) ×4
KIT PRC NS LF DISP ENDO (KITS) ×2 IMPLANT
KIT PROCEDURE OLYMPUS (KITS) ×2
MANIFOLD NEPTUNE II (INSTRUMENTS) ×2 IMPLANT
SNARE COLD EXACTO (MISCELLANEOUS) IMPLANT
TRAP ETRAP POLY (MISCELLANEOUS) IMPLANT
WATER STERILE IRR 250ML POUR (IV SOLUTION) ×2 IMPLANT

## 2022-10-03 NOTE — Op Note (Signed)
[facility] Gastroenterology Patient Name: Lauren Higgins Procedure Date: 10/03/2022 7:13 AM MRN: [id number] Account #: [id number] Date of Birth: 1970/11/22 Admit Type: Outpatient Age: 52 Room: [facility] OR ROOM 01 Gender: Female Note Status: Finalized Instrument Name: [id number] Procedure:             Colonoscopy Indications:           Screening for colorectal malignant neoplasm Providers:             Midge Minium MD, MD Medicines:             Propofol per Anesthesia Complications:         No immediate complications. Procedure:             Pre-Anesthesia Assessment:                        - Prior to the procedure, a History and Physical was                         performed, and patient medications and allergies were                         reviewed. The patient's tolerance of previous                         anesthesia was also reviewed. The risks and benefits                         of the procedure and the sedation options and risks                         were discussed with the patient. All questions were                         answered, and informed consent was obtained. Prior                         Anticoagulants: The patient has taken no anticoagulant                         or antiplatelet agents. ASA Grade Assessment: II - A                         patient with mild systemic disease. After reviewing                         the risks and benefits, the patient was deemed in                         satisfactory condition to undergo the procedure.                        After obtaining informed consent, the colonoscope was                         passed under direct vision. Throughout the procedure,                         the patient's blood pressure,  pulse, and oxygen                         saturations were monitored continuously. The                         Colonoscope was introduced through the anus and                         advanced to the the cecum,  identified by appendiceal                         orifice and ileocecal valve. The colonoscopy was                         performed without difficulty. The patient tolerated                         the procedure well. The quality of the bowel                         preparation was excellent. Findings:      The perianal and digital rectal examinations were normal.      A 4 mm polyp was found in the descending colon. The polyp was sessile.       The polyp was removed with a cold snare. Resection and retrieval were       complete.      Non-bleeding internal hemorrhoids were found during retroflexion. The       hemorrhoids were Grade I (internal hemorrhoids that do not prolapse). Impression:            - One 4 mm polyp in the descending colon, removed with                         a cold snare. Resected and retrieved.                        - Non-bleeding internal hemorrhoids. Recommendation:        - Discharge patient to home.                        - Resume previous diet.                        - Continue present medications.                        - Await pathology results.                        - If the pathology report reveals adenomatous tissue,                         then repeat the colonoscopy for surveillance in 7                         years. Procedure Code(s):     --- Professional ---                        [id number], Colonoscopy, flexible;  with removal of                         tumor(s), polyp(s), or other lesion(s) by snare                         technique Diagnosis Code(s):     --- Professional ---                        Z12.11, Encounter for screening for malignant neoplasm                         of colon                        D12.4, Benign neoplasm of descending colon CPT copyright 2022 American Medical Association. All rights reserved. The codes documented in this report are preliminary and upon coder review may  be revised to meet current compliance  requirements. Midge Minium MD, MD 10/03/2022 7:50:55 AM This report has been signed electronically. Number of Addenda: 0 Note Initiated On: 10/03/2022 7:13 AM Scope Withdrawal Time: 0 hours 9 minutes 52 seconds  Total Procedure Duration: 0 hours 14 minutes 15 seconds  Estimated Blood Loss:  Estimated blood loss: none.      [facility]

## 2022-10-03 NOTE — Anesthesia Postprocedure Evaluation (Signed)
Anesthesia Post Note  Patient: Lauren Higgins  Procedure(s) Performed: COLONOSCOPY WITH BIOPSY (Rectum) POLYPECTOMY (Rectum)  Patient location during evaluation: PACU Anesthesia Type: General Level of consciousness: awake and alert Pain management: pain level controlled Vital Signs Assessment: post-procedure vital signs reviewed and stable Respiratory status: spontaneous breathing, nonlabored ventilation, respiratory function stable and patient connected to nasal cannula oxygen Cardiovascular status: blood pressure returned to baseline and stable Postop Assessment: no apparent nausea or vomiting Anesthetic complications: no   No notable events documented.   Last Vitals:  Vitals:   10/03/22 0750 10/03/22 0800  BP: 127/85 119/73  Pulse: 83 75  Resp: 20 (!) 21  Temp: (!) 36.2 C (!) 36.2 C  SpO2: 100% 99%    Last Pain:  Vitals:   10/03/22 0800  TempSrc:   PainSc: 0-No pain                 Numa Heatwole C Macky Galik

## 2022-10-03 NOTE — H&P (Signed)
Lauren Minium, MD [facility] [address], [address] [place], [place] [address] Phone: [phone] Fax : [phone]  Primary Care Physician:  Mirna Mires, CNM Primary Gastroenterologist:  Dr. Servando Snare  Pre-Procedure History & Physical: HPI:  Lauren Higgins is a 52 y.o. female is here for a screening colonoscopy.   Past Medical History:  Diagnosis Date   Arthritis    Back, knees, thumbs   Chronic lower back pain    right side   Contraception management 02/01/2012   Depression    Graves disease    Hypertension    Hypothyroidism    following iodine ablation   Melanoma (HCC)    Menorrhagia    Motion sickness    cars   Obesity    Open-angle glaucoma    PONV (postoperative nausea and vomiting)    Pre-diabetes    Sciatica of right side    Thyroid eye disease     Past Surgical History:  Procedure Laterality Date   CARPAL TUNNEL RELEASE Right 11/01/2020   Procedure: RIGHT CARPAL TUNNEL RELEASE;  Surgeon: Juanell Fairly, MD;  Location: [facility] ORS;  Service: Orthopedics;  Laterality: Right;   ENDOMETRIAL ABLATION  05/07/2017   Dr Tiburcio Pea   ENDOMETRIAL BIOPSY  2012   proliferative with focal breakdown   EYE SURGERY     multiple eye decompression surgeries from 2015 till present   radioactive iodine ablation     Graves disease   STRABISMUS SURGERY  03/04/2018    Prior to Admission medications   Medication Sig Start Date End Date Taking? Authorizing Provider  acetaminophen (TYLENOL) 500 MG tablet Take 1,000 mg by mouth every 6 (six) hours as needed for moderate pain or mild pain.   Yes [provider]  Berberine Chloride 500 MG CAPS Take by mouth.   Yes [provider]  Cholecalciferol (VITAMIN D3) 125 MCG (5000 UT) CAPS Take 5,000 Units by mouth daily.   Yes [provider]  Cyanocobalamin (VITAMIN B-12) 5000 MCG SUBL Place 5,000 mcg under the tongue 3 (three) times a week.   Yes [provider]  MAGNESIUM OXIDE 400 PO Take 400 mg by mouth  daily.   Yes [provider]  meloxicam (MOBIC) 15 MG tablet TAKE 1 TABLET BY MOUTH ONCE DAILY . TAKEWITH FOOD 08/21/22  Yes Drake Leach, PA-C  Misc Natural Products (ELDERBERRY IMMUNE COMPLEX) CHEW Chew 2 each by mouth daily.   Yes [provider]  NP THYROID 30 MG tablet Take 60 mg by mouth in the morning and at bedtime. 03/30/17  Yes [provider]  phentermine (ADIPEX-P) 37.5 MG tablet Take 37.5 mg by mouth daily before breakfast. 03/17/13  Yes [provider]  Probiotic Product (PROBIOTIC DAILY PO) Take by mouth.   Yes [provider]    Allergies as of 07/24/2022   (No Known Allergies)    Family History  Problem Relation Age of Onset   Thyroid disease Father    Hypertension Father    Skin cancer Father    Dementia Maternal Grandmother        dementia   Breast cancer Maternal Grandmother 63   Stroke Paternal Grandfather    Glaucoma Mother    Hypertension Mother    Breast cancer Paternal Aunt 3   Cancer Cousin 12    Social History   Socioeconomic History   Marital status: Married    Spouse name: Not on file   Number of children: 1   Years of education: Not  on file   Highest education level: Not on file  Occupational History   Occupation: Child psychotherapist  Tobacco Use   Smoking status: Never   Smokeless tobacco: Never  Vaping Use   Vaping status: Never Used  Substance and Sexual Activity   Alcohol use: No   Drug use: No   Sexual activity: Yes    Partners: Male    Birth control/protection: Condom, Surgical    Comment: Vasectomy  Other Topics Concern   Not on file  Social History Narrative   Not on file   Social Determinants of Health   Financial Resource Strain: Not on file  Food Insecurity: Not on file  Transportation Needs: Not on file  Physical Activity: Sufficiently Active (04/02/2017)   Exercise Vital Sign    Days of Exercise per Week: 4 days    Minutes of Exercise per Session: 60 min  Stress: No Stress  Concern Present (04/02/2017)   [employer] of Occupational Health - Occupational Stress Questionnaire    Feeling of Stress : Not at all  Social Connections: Not on file  Intimate Partner Violence: Not on file    Review of Systems: See HPI, otherwise negative ROS  Physical Exam: Pulse 75   Temp 97.7 F (36.5 C) (Temporal)   Resp 15   Ht 5\' 9"  (1.753 m)   Wt 103.9 kg   SpO2 96%   BMI 33.83 kg/m  General:   Alert,  pleasant and cooperative in NAD Head:  Normocephalic and atraumatic. Neck:  Supple; no masses or thyromegaly. Lungs:  Clear throughout to auscultation.    Heart:  Regular rate and rhythm. Abdomen:  Soft, nontender and nondistended. Normal bowel sounds, without guarding, and without rebound.   Neurologic:  Alert and  oriented x4;  grossly normal neurologically.  Impression/Plan: [name] is now here to undergo a screening colonoscopy.  Risks, benefits, and alternatives regarding colonoscopy have been reviewed with the patient.  Questions have been answered.  All parties agreeable.

## 2022-10-03 NOTE — Transfer of Care (Signed)
Immediate Anesthesia Transfer of Care Note  Patient: Lauren Higgins  Procedure(s) Performed: COLONOSCOPY WITH BIOPSY (Rectum) POLYPECTOMY (Rectum)  Patient Location: PACU  Anesthesia Type: General  Level of Consciousness: awake, alert  and patient cooperative  Airway and Oxygen Therapy: Patient Spontanous Breathing and Patient connected to supplemental oxygen  Post-op Assessment: Post-op Vital signs reviewed, Patient's Cardiovascular Status Stable, Respiratory Function Stable, Patent Airway and No signs of Nausea or vomiting  Post-op Vital Signs: Reviewed and stable  Complications: No notable events documented.

## 2022-10-06 ENCOUNTER — Encounter: Payer: Self-pay | Admitting: Gastroenterology

## 2022-10-06 LAB — SURGICAL PATHOLOGY

## 2022-10-07 ENCOUNTER — Encounter: Payer: Self-pay | Admitting: Gastroenterology

## 2022-10-17 ENCOUNTER — Other Ambulatory Visit: Payer: Self-pay | Admitting: Orthopedic Surgery

## 2022-10-17 DIAGNOSIS — M5441 Lumbago with sciatica, right side: Secondary | ICD-10-CM

## 2022-10-17 DIAGNOSIS — M5416 Radiculopathy, lumbar region: Secondary | ICD-10-CM

## 2022-10-20 NOTE — Telephone Encounter (Signed)
She was given 1 month of mobic on 08/21/22 with 2 refills. She should have enough mobic until 11/21/22. Once she is out of this, she should get additional refills from her PCP or from PMR.   Please let her know.

## 2022-10-20 NOTE — Telephone Encounter (Signed)
Patient notified and expressed understanding.

## 2022-10-27 DIAGNOSIS — H40023 Open angle with borderline findings, high risk, bilateral: Secondary | ICD-10-CM | POA: Diagnosis not present

## 2022-11-03 DIAGNOSIS — M5416 Radiculopathy, lumbar region: Secondary | ICD-10-CM | POA: Diagnosis not present

## 2022-11-03 DIAGNOSIS — M47816 Spondylosis without myelopathy or radiculopathy, lumbar region: Secondary | ICD-10-CM | POA: Diagnosis not present

## 2022-11-03 DIAGNOSIS — M5126 Other intervertebral disc displacement, lumbar region: Secondary | ICD-10-CM | POA: Diagnosis not present

## 2022-11-03 DIAGNOSIS — M48062 Spinal stenosis, lumbar region with neurogenic claudication: Secondary | ICD-10-CM | POA: Diagnosis not present

## 2022-11-03 DIAGNOSIS — M461 Sacroiliitis, not elsewhere classified: Secondary | ICD-10-CM | POA: Diagnosis not present

## 2022-11-12 DIAGNOSIS — M79669 Pain in unspecified lower leg: Secondary | ICD-10-CM | POA: Diagnosis not present

## 2022-11-12 DIAGNOSIS — M545 Low back pain, unspecified: Secondary | ICD-10-CM | POA: Diagnosis not present

## 2022-12-19 DIAGNOSIS — E063 Autoimmune thyroiditis: Secondary | ICD-10-CM | POA: Diagnosis not present

## 2022-12-19 DIAGNOSIS — L659 Nonscarring hair loss, unspecified: Secondary | ICD-10-CM | POA: Diagnosis not present

## 2022-12-19 DIAGNOSIS — R946 Abnormal results of thyroid function studies: Secondary | ICD-10-CM | POA: Diagnosis not present

## 2022-12-19 DIAGNOSIS — R5383 Other fatigue: Secondary | ICD-10-CM | POA: Diagnosis not present

## 2022-12-19 DIAGNOSIS — E538 Deficiency of other specified B group vitamins: Secondary | ICD-10-CM | POA: Diagnosis not present

## 2022-12-19 DIAGNOSIS — E669 Obesity, unspecified: Secondary | ICD-10-CM | POA: Diagnosis not present

## 2022-12-19 DIAGNOSIS — Z79899 Other long term (current) drug therapy: Secondary | ICD-10-CM | POA: Diagnosis not present

## 2023-02-10 DIAGNOSIS — M545 Low back pain, unspecified: Secondary | ICD-10-CM | POA: Diagnosis not present

## 2023-02-10 DIAGNOSIS — M79669 Pain in unspecified lower leg: Secondary | ICD-10-CM | POA: Diagnosis not present

## 2023-02-14 IMAGING — MG MM DIGITAL SCREENING BILAT W/ TOMO AND CAD
6 of 10 series · 6 of 30 positions shown · non-contrast
Comparison: Previous exam(s).

CLINICAL DATA: Screening.

EXAM:
DIGITAL SCREENING BILATERAL MAMMOGRAM WITH TOMOSYNTHESIS AND CAD
TECHNIQUE: Bilateral screening digital craniocaudal and mediolateral oblique
mammograms were obtained. Bilateral screening digital breast
tomosynthesis was performed. The images were evaluated with
computer-aided detection.

[L MLO synth-2D (1 of 2)]
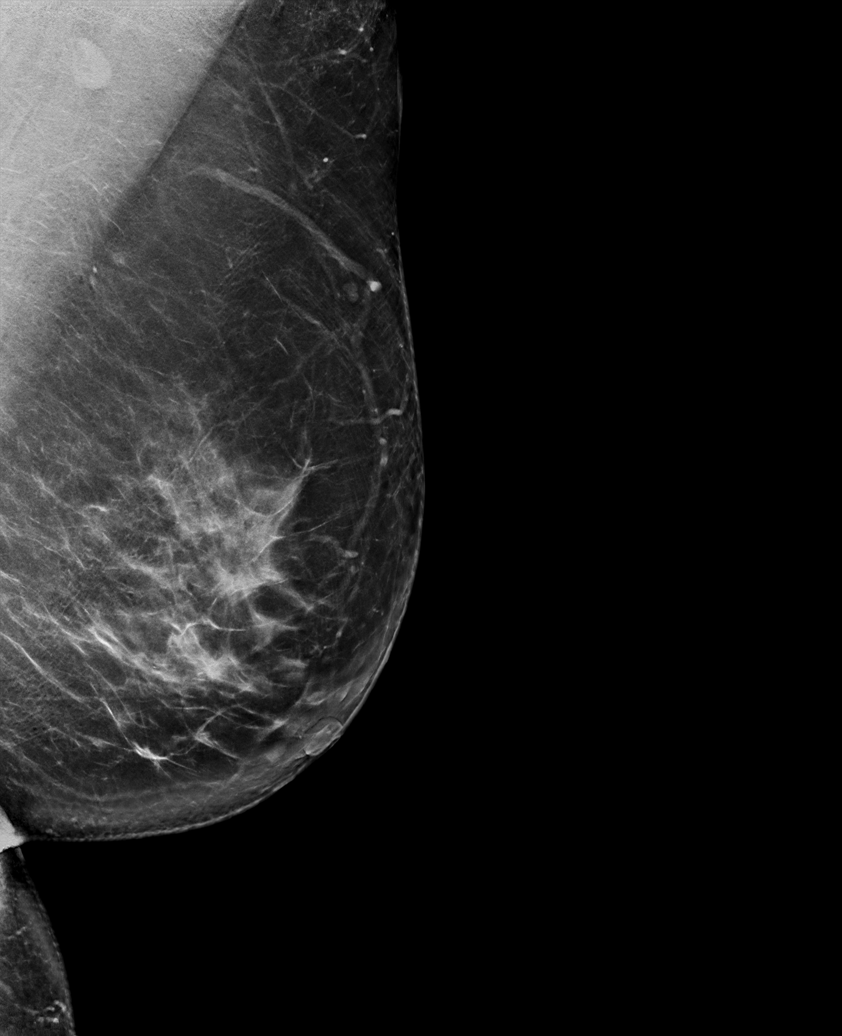

[L MLO synth-2D (2 of 2)]
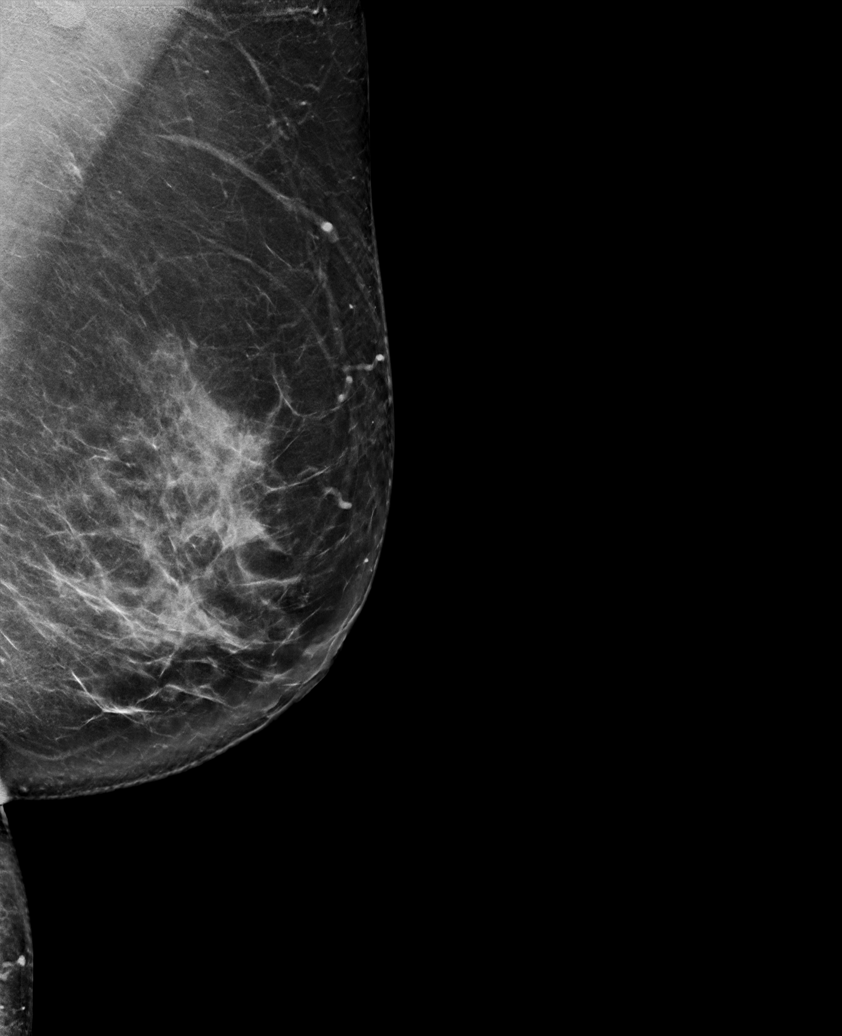

[L CC synth-2D]
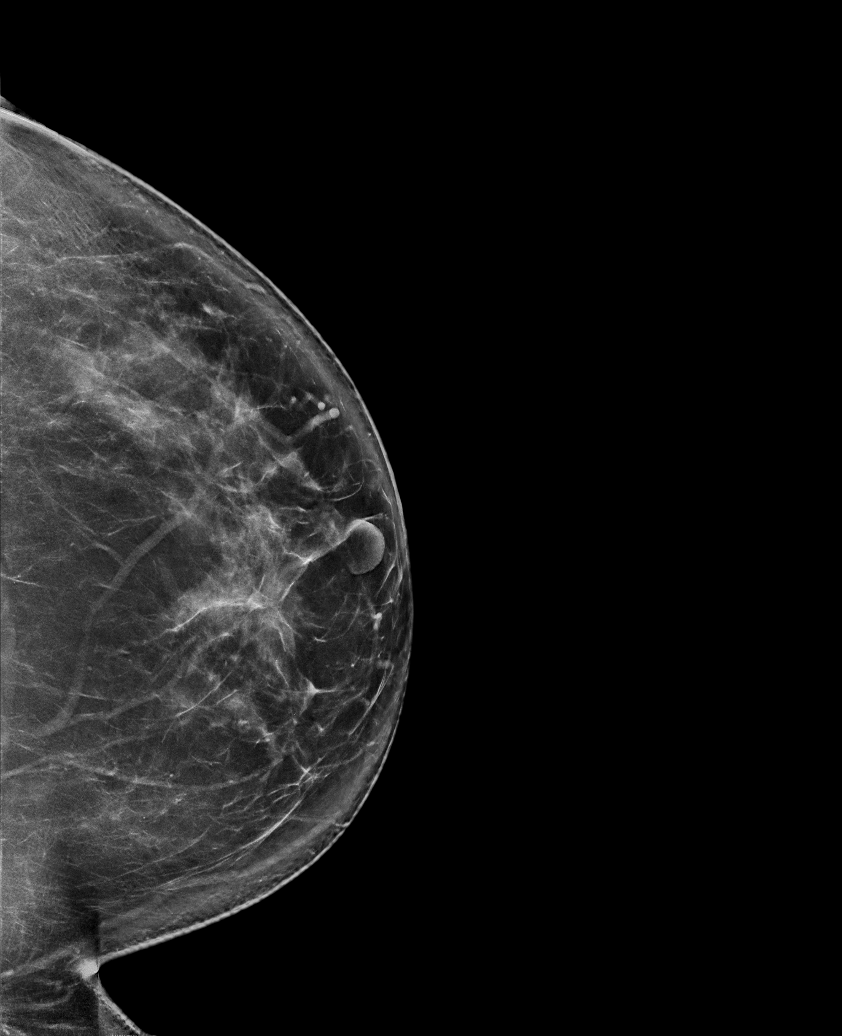

[R CC synth-2D]
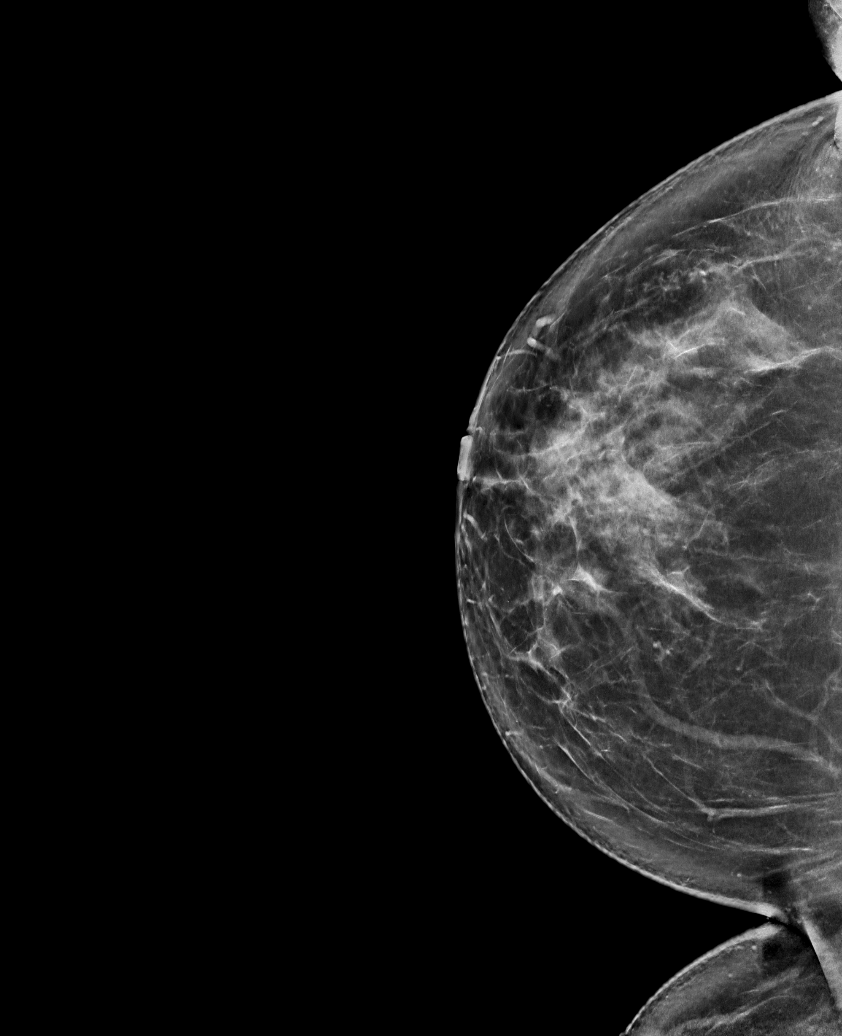

[R MLO synth-2D]
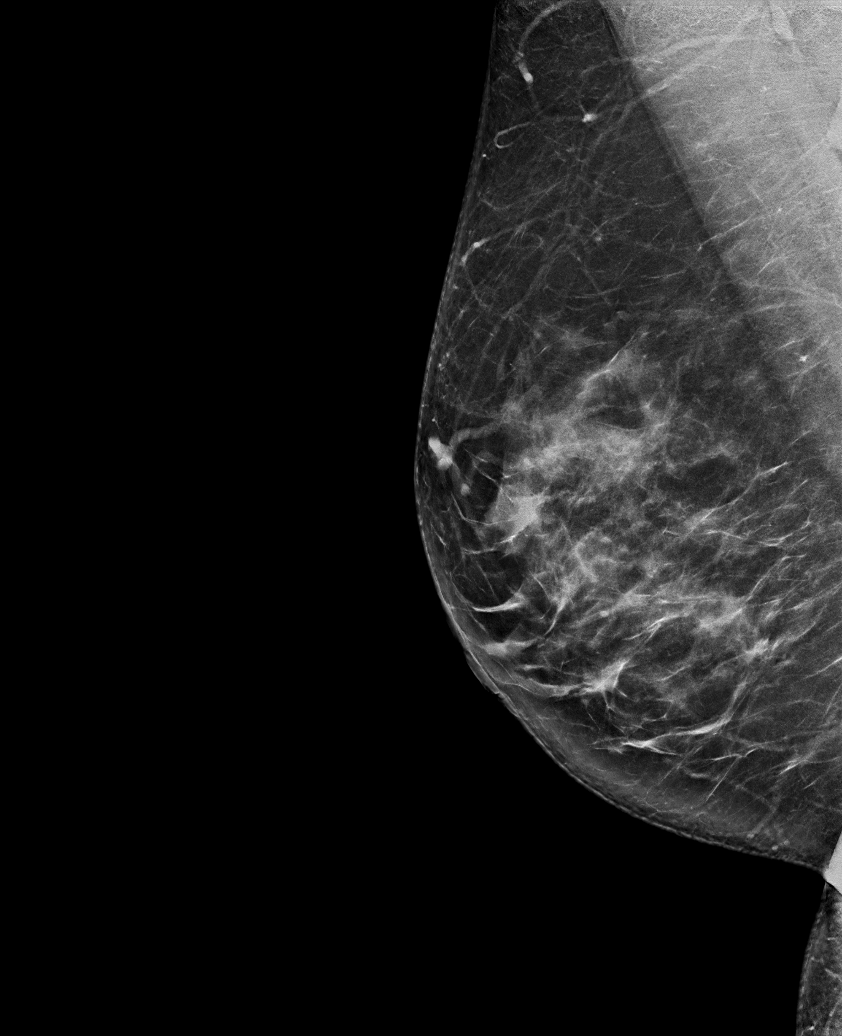

[L MLO tomo · tomo slice 49/96.0]
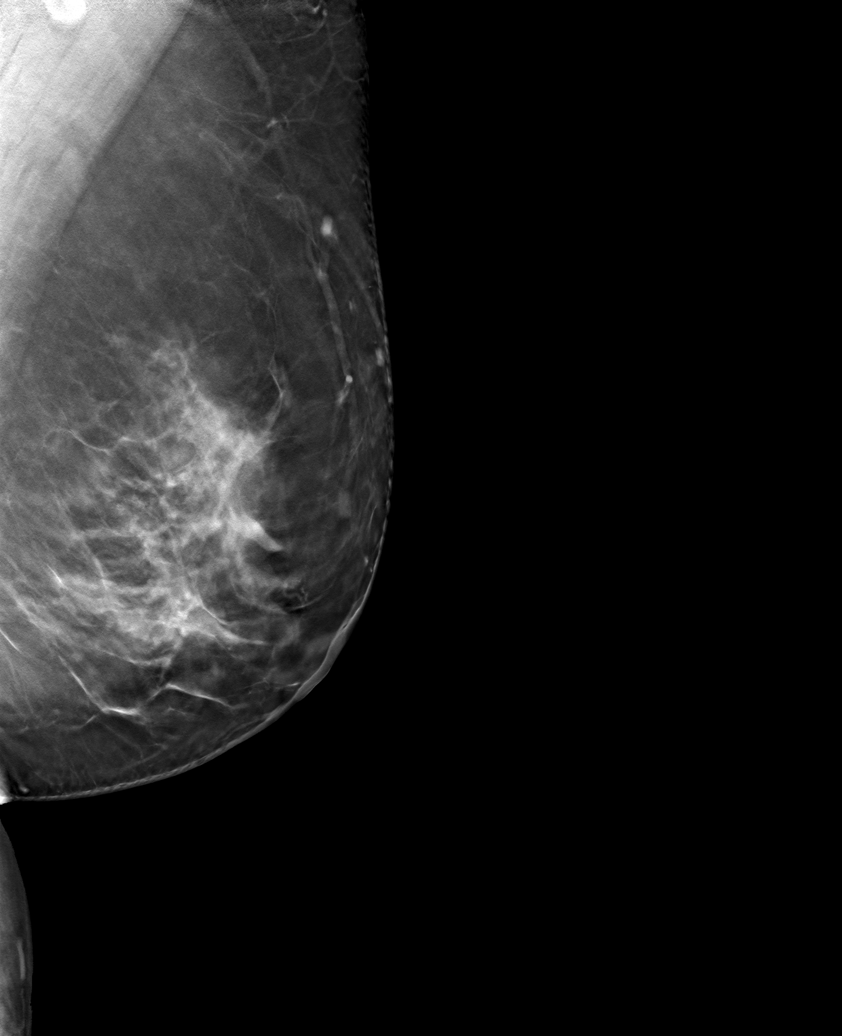

[6 of 30 positions shown; findings below may reference images not displayed]

ACR Breast Density Category b: There are scattered areas of
fibroglandular density.
FINDINGS: There are no findings suspicious for malignancy.
IMPRESSION: No mammographic evidence of malignancy. A result letter of this
screening mammogram will be mailed directly to the patient.

RECOMMENDATION:
Screening mammogram in one year. (Code:51-O-LD2)

BI-RADS CATEGORY  1: Negative.

## 2023-02-16 ENCOUNTER — Other Ambulatory Visit: Payer: Self-pay | Admitting: Obstetrics

## 2023-02-20 DIAGNOSIS — M1611 Unilateral primary osteoarthritis, right hip: Secondary | ICD-10-CM | POA: Diagnosis not present

## 2023-02-20 DIAGNOSIS — M5416 Radiculopathy, lumbar region: Secondary | ICD-10-CM | POA: Diagnosis not present

## 2023-02-20 DIAGNOSIS — M48062 Spinal stenosis, lumbar region with neurogenic claudication: Secondary | ICD-10-CM | POA: Diagnosis not present

## 2023-02-20 DIAGNOSIS — M5126 Other intervertebral disc displacement, lumbar region: Secondary | ICD-10-CM | POA: Diagnosis not present

## 2023-03-03 ENCOUNTER — Other Ambulatory Visit: Payer: Self-pay | Admitting: Family Medicine

## 2023-03-03 DIAGNOSIS — M25551 Pain in right hip: Secondary | ICD-10-CM

## 2023-03-07 ENCOUNTER — Ambulatory Visit
Admission: RE | Admit: 2023-03-07 | Discharge: 2023-03-07 | Disposition: A | Discharge status: Home / Self Care | Payer: 59 | Source: Ambulatory Visit | Attending: Family Medicine | Admitting: Family Medicine

## 2023-03-07 DIAGNOSIS — M25551 Pain in right hip: Secondary | ICD-10-CM | POA: Diagnosis not present

## 2023-03-12 DIAGNOSIS — M5126 Other intervertebral disc displacement, lumbar region: Secondary | ICD-10-CM | POA: Diagnosis not present

## 2023-03-12 DIAGNOSIS — M1611 Unilateral primary osteoarthritis, right hip: Secondary | ICD-10-CM | POA: Diagnosis not present

## 2023-03-12 DIAGNOSIS — M48062 Spinal stenosis, lumbar region with neurogenic claudication: Secondary | ICD-10-CM | POA: Diagnosis not present

## 2023-03-12 DIAGNOSIS — M5416 Radiculopathy, lumbar region: Secondary | ICD-10-CM | POA: Diagnosis not present

## 2023-03-24 DIAGNOSIS — M5126 Other intervertebral disc displacement, lumbar region: Secondary | ICD-10-CM | POA: Diagnosis not present

## 2023-03-24 DIAGNOSIS — M5416 Radiculopathy, lumbar region: Secondary | ICD-10-CM | POA: Diagnosis not present

## 2023-04-27 DIAGNOSIS — M1611 Unilateral primary osteoarthritis, right hip: Secondary | ICD-10-CM | POA: Diagnosis not present

## 2023-04-27 DIAGNOSIS — M5126 Other intervertebral disc displacement, lumbar region: Secondary | ICD-10-CM | POA: Diagnosis not present

## 2023-04-27 DIAGNOSIS — M48062 Spinal stenosis, lumbar region with neurogenic claudication: Secondary | ICD-10-CM | POA: Diagnosis not present

## 2023-04-27 DIAGNOSIS — M5416 Radiculopathy, lumbar region: Secondary | ICD-10-CM | POA: Diagnosis not present

## 2023-04-30 DIAGNOSIS — H40023 Open angle with borderline findings, high risk, bilateral: Secondary | ICD-10-CM | POA: Diagnosis not present

## 2023-06-25 DIAGNOSIS — N958 Other specified menopausal and perimenopausal disorders: Secondary | ICD-10-CM | POA: Diagnosis not present

## 2023-07-06 DIAGNOSIS — L821 Other seborrheic keratosis: Secondary | ICD-10-CM | POA: Diagnosis not present

## 2023-07-06 DIAGNOSIS — Z86018 Personal history of other benign neoplasm: Secondary | ICD-10-CM | POA: Diagnosis not present

## 2023-07-06 DIAGNOSIS — Z859 Personal history of malignant neoplasm, unspecified: Secondary | ICD-10-CM | POA: Diagnosis not present

## 2023-07-06 DIAGNOSIS — L570 Actinic keratosis: Secondary | ICD-10-CM | POA: Diagnosis not present

## 2023-07-06 DIAGNOSIS — Z872 Personal history of diseases of the skin and subcutaneous tissue: Secondary | ICD-10-CM | POA: Diagnosis not present

## 2023-07-06 DIAGNOSIS — L578 Other skin changes due to chronic exposure to nonionizing radiation: Secondary | ICD-10-CM | POA: Diagnosis not present

## 2023-07-06 DIAGNOSIS — Z85828 Personal history of other malignant neoplasm of skin: Secondary | ICD-10-CM | POA: Diagnosis not present

## 2023-07-06 DIAGNOSIS — L308 Other specified dermatitis: Secondary | ICD-10-CM | POA: Diagnosis not present

## 2023-07-06 DIAGNOSIS — Z85820 Personal history of malignant melanoma of skin: Secondary | ICD-10-CM | POA: Diagnosis not present

## 2023-07-09 DIAGNOSIS — E039 Hypothyroidism, unspecified: Secondary | ICD-10-CM | POA: Diagnosis not present

## 2023-07-09 DIAGNOSIS — E063 Autoimmune thyroiditis: Secondary | ICD-10-CM | POA: Diagnosis not present

## 2023-07-09 DIAGNOSIS — E538 Deficiency of other specified B group vitamins: Secondary | ICD-10-CM | POA: Diagnosis not present

## 2023-07-09 DIAGNOSIS — E559 Vitamin D deficiency, unspecified: Secondary | ICD-10-CM | POA: Diagnosis not present

## 2023-07-09 DIAGNOSIS — R5383 Other fatigue: Secondary | ICD-10-CM | POA: Diagnosis not present

## 2023-07-09 DIAGNOSIS — R946 Abnormal results of thyroid function studies: Secondary | ICD-10-CM | POA: Diagnosis not present

## 2023-07-15 DIAGNOSIS — M25551 Pain in right hip: Secondary | ICD-10-CM | POA: Diagnosis not present

## 2023-07-15 NOTE — Progress Notes (Signed)
 ORTHOPEDIC SURGERY - HIP EVALUATION  Chief Complaint: Chief Complaint  Patient presents with  . Hip Pain    Right hip pain    History of Present Illness: 07/15/23: Lauren Higgins is a 53 y.o. female  referred by Chasnis for R hip evaluation and management.  Prior medical records were reviewed.  She was last evaluated in physiatry by Dr. Avanell on On 04/27/2023.  He has been managed with the physiatry team with multiple epidural spinal injections.  She was referred to me for further evaluation of buttock and anterior thigh pain.  She currently rates pain severity as a 6/10. Her symptoms began ~1.5 years ago as sciatica type pain.  She saw a chiropractor and modified her diet, but this did not significantly help her.  She was then referred to Dr. Precious team and neurosurgery, and she was then referred to Dr. Avanell for possible injections.  She is undergone multiple injections to her SI joint as well as lumbar spine.  Most recent injections in March 2025 gave her close to complete relief, but just for a few days.  She did also undergo a hip joint injection in February 2025 that gave her relief of some pain for few days as well for her pain returned.  Since that time, she has continued to have pain, primarily in the buttock region with some pain down the lateral aspect of the thigh.  Of note, she states she had a fall a traumatic incident to the lateral aspect of the hip and now has an overlying hematoma.  However, her pain has not changed since this hematoma.  It is not tender.  She works in Scientist, clinical (histocompatibility and immunogenetics) at [employer].  She enjoys walking and riding her bike for recreation.  She has never smoked.  She lives with her husband at home.  PMHx, PSurgHx, Fam Hx, Soc Hx, Meds, Allergies: Past Medical History:  Diagnosis Date  . Diplopia   . Graves' disease 11/23/2013  . Graves' orbitopathy   . Hypertension 11/23/2013  . Hypertropia of left eye 02/08/2014  . Hypothyroidism, postablative    . Melanoma (CMS/HHS-HCC) 10/26/2014   Left thigh melanoma  . Obesity 11/23/2013  . Personal history of other specified diseases(V13.89)   . PONV (postoperative nausea and vomiting)    with surgery in 12/2013  . Pre-diabetes 11/23/2013  . Thyroid  eye disease    Under treatment of Dr. Barrington & Dr. Prentiss [facility])- is symptomatic with thyroid  eye disease (right eye swelling & redness)- 08/29/14  . Vitamin D  deficiency     Past Surgical History:  Procedure Laterality Date  . THYROID  SURGERY  06/1992   Radio active iodine treatment  . ORBITOTOMY W/REMOVAL BONE FOR DECOMP Right 12/09/2013   Procedure: ORBITOTOMY W/REMOVAL BONE FOR DECOMP;  Surgeon: Ozell Selinda Ade, MD;  [facility];  Service: Ophthalmology;  Laterality: Right;  . STRABISMUS SURGERY 1 VERTICAL MUSCLE SUPERIOR RECTUS Right 06/01/2014   Procedure: STRABISMUS SURGERY, RECESSION OR RESECTION PROCEDURE; 1 VERTICAL MUSCLE (EXCLUDING SUPERIOR OBLIQUE;  Surgeon: Alm Francis Prentiss, MD;  [facility];  Service: Ophthalmology;  Laterality: Right;  . STRABISMUS SURGERY 1 HORIZONTAL MUSCLE MEDIAL/LATERAL Right 06/01/2014   Procedure: STRABISMUS SURGERY 1 HORIZONTAL MUSCLE MEDIAL/LATERAL;  Surgeon: Alm Francis Prentiss, MD;  [facility];  Service: Ophthalmology;  Laterality: Right;  . STRABISMUS SURGERY FOR SCARRING EXTRAOCULAR MUSCLE Right 06/01/2014   Procedure: STRABISMUS SURGERY FOR SCARRING EXTRAOCULAR MUSCLE;  Surgeon: Alm Francis Prentiss, MD;  [facility];  Service: Ophthalmology;  Laterality: Right;  inferior rectus and medial rectus  . Melanoma from left upper thigh  11/17/2014   Dr. Warden  . CONJUNCTIVOPLASTY W/GRAFT/EXTENSIVE REARRANGEMENT Right 12/11/2014   Procedure: RIGHT EYE CONJUNCTIVOPLASTY WITH REPAIR OF UPPER EYELID RETRACTION AND MEDIAL TARSORRHAPHY;  Surgeon: Selinda Beverley Hertz, MD;  Location: [facility] OR;  Service: Ophthalmology;  Laterality: Right;  . CORRECTION LID  RETRACTION Right 12/11/2014   Procedure: CORRECTION OF LID RETRACTION;  Surgeon: Selinda Beverley Hertz, MD;  Location: [facility] OR;  Service: Ophthalmology;  Laterality: Right;  . CONSTRUCTION INTERMARGINAL ADHESIONS TARSORRHAPHY Right 12/11/2014   Procedure: CONSTRUCTION OF INTERMARGINAL ADHESIONS, MEDIAN TARSORRHAPHY, OR CANTHORRHAPHY;  Surgeon: Selinda Beverley Hertz, MD;  Location: [facility] OR;  Service: Ophthalmology;  Laterality: Right;  . ORBITOTOMY W/REMOVAL BONE FOR DECOMP Right 03/12/2015   Procedure: Orbital decompression right eye;  Surgeon: Serene Skeeter, MD;  Location: [facility] OR;  Service: Ophthalmology;  Laterality: Right;  . ORBITOTOMY W/BONE FLAP W/BONE REMOVAL BY LATERAL APPROACH Right 03/12/2015   Procedure: ORBITOTOMY W/BONE FLAP W/BONE REMOVAL BY LATERAL APPROACH;  Surgeon: Serene Skeeter, MD;  Location: [facility] OR;  Service: Ophthalmology;  Laterality: Right;  . ETHMOIDECTOMY EXTRANASAL Right 03/12/2015   Procedure: ETHMOIDECTOMY EXTRANASAL;  Surgeon: Serene Skeeter, MD;  Location: [facility] OR;  Service: Ophthalmology;  Laterality: Right;  . ORBITOTOMY W/REMOVAL BONE FOR DECOMP Left 01/30/2016   Procedure: LEFT ORBITOTOMY WITH REMOVAL BONE FOR DECOMPRESSION, ETHMOIDECTOMY EXTRANASAL;  Surgeon: Serene Skeeter, MD;  Location: [facility] OR;  Service: Ophthalmology;  Laterality: Left;  . ETHMOIDECTOMY EXTRANASAL Left 01/30/2016   Procedure: ETHMOIDECTOMY EXTRANASAL;  Surgeon: Serene Skeeter, MD;  Location: [facility] OR;  Service: Ophthalmology;  Laterality: Left;  . ORBITOTOMY W/BONE FLAP W/BONE REMOVAL BY LATERAL APPROACH Left 05/26/2016   Procedure: LEFT ORBITOTOMY WITH BONE FLAP WITH BONE REMOVAL BY LATERAL APPROACH;  Surgeon: Serene Skeeter, MD;  [facility] OR;  Service: Ophthalmology;  Laterality: Left;  . STRABISMUS SURGERY W/PLACEMENT ADJUSTABLE SUTURE Bilateral 01/28/2017   Procedure: PLACEMENT OF ADJUSTABLE SUTURE(S) DURING STRABISMUS SURGERY, INCLUDING POSTOPERATIVE ADJUSTMENT(S) OF SUTURE(S) (LIST N ADDITION  TO SPECIFIC STRABISMUS SURGERY);  Surgeon: Omega Leita NOVAK, MD;  [facility];  Service: Ophthalmology;  Laterality: Bilateral;  . STRABISMUS SURGERY 1 HORIZONTAL MUSCLE MEDIAL/LATERAL Bilateral 01/28/2017   Procedure: STRABISMUS SURGERY, RECESSION OR RESECTION PROCEDURE; 1 HORIZONTAL MUSCLE;  Surgeon: Omega Leita NOVAK, MD;  [facility];  Service: Ophthalmology;  Laterality: Bilateral;  . STRABISMUS SURGERY 1 VERTICAL MUSCLE SUPERIOR RECTUS Bilateral 01/28/2017   Procedure: STRABISMUS SURGERY, RECESSION OR RESECTION PROCEDURE; 1 VERTICAL MUSCLE (EXCLUDING SUPERIOR OBLIQUE);  Surgeon: Omega Leita NOVAK, MD;  [facility];  Service: Ophthalmology;  Laterality: Bilateral;  . STRABISMUS SURGERY 1 VERTICAL MUSCLE SUPERIOR RECTUS Bilateral 03/04/2018   Procedure: STRABISMUS SURGERY, RECESSION OR RESECTION PROCEDURE; 1 VERTICAL MUSCLE (EXCLUDING SUPERIOR OBLIQUE);  Surgeon: Darleene Caretha MATSU, MD;  [facility];  Service: Ophthalmology;  Laterality: Bilateral;  . STRABISMUS SURGERY 1 HORIZONTAL MUSCLE MEDIAL/LATERAL Bilateral 03/04/2018   Procedure: STRABISMUS SURGERY, RECESSION OR RESECTION PROCEDURE; 1 HORIZONTAL MUSCLE;  Surgeon: Darleene Caretha MATSU, MD;  [facility];  Service: Ophthalmology;  Laterality: Bilateral;  . STRABISMUS SURGERY W/PLACEMENT ADJUSTABLE SUTURE Bilateral 03/04/2018   Procedure: PLACEMENT OF ADJUSTABLE SUTURE(S) DURING STRABISMUS SURGERY, INCLUDING POSTOPERATIVE ADJUSTMENT(S) OF SUTURE(S) (LIST N ADDITION TO SPECIFIC STRABISMUS SURGERY);  Surgeon: Darleene Caretha MATSU, MD;  [facility];  Service: Ophthalmology;  Laterality: Bilateral;  . RELEASE EXTENSIVE SCAR TISSUE Bilateral 03/04/2018  Procedure: RELEASE OF EXTENSIVE SCAR TISSUE WITHOUT DETACHING EXTRAOCULAR MUSCLE (SEPARATE PROCEDURE);  Surgeon: Darleene Caretha MATSU, MD;  [facility];  Service: Ophthalmology;  Laterality: Bilateral;  . EYE SURGERY    .  NEUROPLASTY / TRANSPOSITION MEDIAN NERVE AT CARPAL TUNNEL BILATERAL Right     Family History  Problem Relation Age of Onset  . Glaucoma Mother   . Anesthesia problems Mother        nausea  . Macular degeneration Other   . Hypothyroidism Father   . High blood pressure (Hypertension) Father   . Graves' disease Father   . No Known Problems Sister   . Diabetes Neg Hx     Social History   Socioeconomic History  . Marital status: Married    Spouse name: francis  . Number of children: 1  Occupational History  . Occupation: Child psychotherapist  Tobacco Use  . Smoking status: Never  . Smokeless tobacco: Never  Vaping Use  . Vaping status: Never Used  Substance and Sexual Activity  . Alcohol use: No    Alcohol/week: 0.0 standard drinks of alcohol  . Drug use: No  . Sexual activity: Defer  Social History Narrative   Marital Status- Married   Lives with husband and son   Employment- [employer])   Exercise hx- [employer]   Religious Affiliation- Christian   Social Drivers of Health   Financial Resource Strain: Low Risk  (07/15/2023)   Overall Financial Resource Strain (CARDIA)   . Difficulty of Paying Living Expenses: Not hard at all  Food Insecurity: No Food Insecurity (07/15/2023)   Hunger Vital Sign   . Worried About Programme researcher, broadcasting/film/video in the Last Year: Never true   . Ran Out of Food in the Last Year: Never true  Transportation Needs: No Transportation Needs (07/15/2023)   PRAPARE - Transportation   . Lack of Transportation (Medical): No   . Lack of Transportation (Non-Medical): No  Physical Activity: Sufficiently Active (04/02/2017)   Received from [facility]   Exercise Vital Sign   . Days of Exercise per Week: 4 days   . Minutes of Exercise per Session: 60 min  Stress: No Stress Concern Present (04/02/2017)   Received from [facility] of Occupational Health - Occupational Stress Questionnaire   . Feeling of Stress : Not at all   Housing Stability: Low Risk  (07/15/2023)   Housing Stability Vital Sign   . Unable to Pay for Housing in the Last Year: No   . Number of Times Moved in the Last Year: 0   . Homeless in the Last Year: No     Current Outpatient Medications  Medication Sig Dispense Refill  . acetaminophen  (TYLENOL ) 500 MG tablet Take by mouth    . cholecalciferol (VITAMIN D3) 5,000 unit capsule Take by mouth    . cyanocobalamin , vitamin B-12, 5,000 mcg Subl Place 1 tablet under the tongue once daily    . gabapentin  (NEURONTIN ) 300 MG capsule Take 1 capsule (300 mg total) by mouth once daily 30 capsule 5  . Herbal Supplement Mega Sport Biotic- 2 tablets by mouth once daily    . HYDROcodone -acetaminophen  (NORCO) 5-325 mg tablet 1/2-1 po bid prn 10 tablet 0  . lanolin/mineral oil/petrolatum (ARTIFICIAL TEARS OPHTH) Apply to eye    . MAGNESIUM  ORAL Take 2 tablets by mouth once daily    . multivitamin tablet Take 1 tablet by mouth once daily    .  NON FORMULARY Take by mouth    . thyroid  (ARMOUR) 120 mg tablet Take 60 mg by mouth 2 (two) times daily    . traMADoL (ULTRAM) 50 mg tablet 1-2 po bid prn (Patient not taking: Reported on 04/27/2023) 120 tablet 2   No current facility-administered medications for this visit.    Allergies  Allergen Reactions  . Adhesive Tape-Silicones Rash    Whelts    Review of Systems: A 10+ ROS was performed, reviewed, and the pertinent orthopaedic findings are documented in the HPI.  I have reviewed and agree with the ROS captured by the CMA.    Physical Exam: Vitals:   07/15/23 1101  BP: 110/62   General/Constitutional: NAD, conversant Eyes: Pupils equal and round, extraocular movements intact ENT: atraumatic external nose and ears, moist mucous membranes Respiratory: non-labored breathing, symmetric chest rise Cardiovascular: no visible lower extremity edema, peripheral pulses present  Skin: normal skin turgor, warm and dry Neurological: cranial nerves grossly  intact, sensation grossly intact Psychological:  Appropriate mood and affect; appropriate judgment Musculoskeletal: as detailed below:  Comprehensive Hip Exam:  Gait Patient does not use an assistive device.  Leg Lengths Equal   RIGHT  LEFT  Inspection No atrophy, skin lesions or scars No atrophy, skin lesions or scars   Palpation    Tenderness Over buttock and lateral aspect of leg about IT band.  Minimal tenderness over greater trochanter No location  Lateral Compression of the pelvis does not reproduce pain complaints.  Range of Motion    Qualitative Normal flexion Normal flexion  Pain with ROM at mid-flexion Evidence of hip irritability consistent with synovitis  Is not present   Quantitative    Flexion 90 90  Extension 5   Abduction    Adduction    ER at 90 of flexion 40 40  IR at 90 of flexion 10 10  Strength    Flexion 5/5 5/5   Adduction 5/5 5/5   Abduction 5/5  5/5   Pain Provocation Tests    Impingement (FADIR) Negative Negative   Sub-spine Impingement Negative  Negative   Psoas Impingement/Anterior Capsule Negative    Instability/log roll Negative  Negative   Posterior Impingement Negative    Lateral Rim Impingement Negative     Trochanteric Pain Sign Equivocal   Ischio-femoral Impingement Sign Negative    Seated piriformis stretch (90 deg hip flexion, knee extension --> leg adducted and IR passively)    Piriformis active test (lateral decubitus, heel on table --> active abd + ER against resistance)    Straight leg raise (passive)    Straight leg raise (active)    Circumduction clunk    Peritrochanteric Space Exam    Pain over trochanter Minimal, does not reproduce daily symptoms   Snapping with the bicycle maneuver Negative     Ober's Test Mildly positive   Weakness in abduction None   Posterior Chain    Recruitment Good   Neurovascular    Distal Pulse normal normal  Distal Motor Normal Normal  Distal Sensation Normal Normal    Lumbar Spine   Patient  has symmetric lumbar range of motion.    Imaging:  R Hip radiographs:  07/15/2023: 3 views of the right hip (AP, Dunn lateral, and false profile) were obtained. There are no fractures or dislocations.  There are mild degenerative changes to the hip joint with slight joint space narrowing.  There is still 4 mm of joint space superiorly.  There are mild enthesopathic changes  about the greater trochanter.  Lateral center edge angle measures 43 degrees.  Anterior center edge angle measures 29 degrees.  There does appear to be a cam deformity with loss of head-neck offset.  Alpha angle measures 63 degrees.  R Hip MRI: 03/07/23: FINDINGS:  Bones: Mild-to-moderate pubic symphysis joint space narrowing and  peripheral osteophytosis. No acute fracture or avascular necrosis is  seen within the visualized portions of the pelvis or either proximal  femur.   Articular cartilage and labrum   Right hip:   Articular cartilage: Mild to moderate anterior superior right  femoral head and acetabular cartilage thinning and normal morphology  of the right femoral head-neck junction without CAM-type bump  deformity.   Labrum: Within the limitation of lack of intra-articular fluid, no  definite right acetabular labral tear is seen.   On large field-of-view images of the left hip, there is likely  similar mild superior left femoroacetabular cartilage thinning.   Joint or bursal effusion   Joint effusion:  No joint effusion within either hip.   Bursae: Mild edema without frank fluid within the bilateral  trochanteric bursae.   Muscles and tendons   Muscles and tendons: Normal size and signal of the regional  musculature. The origins of the bilateral rectus femoris tendons and  the bilateral common hamstring tendon origins are intact. The  insertions of the bilateral iliopsoas tendons are intact. There is  mild fluid deep to the right greater than left gluteus minimus  tendon insertionsa (axial series  105, image 27 on the right, image  17 on the left), mild tenosynovitis and/or sub-gluteal bursitis. The  bilateral gluteus medius tendon insertions are intact. The rectus  abdominis-adductor aponeuroses are intact.   Other findings   Miscellaneous: There is an 11 mm round decreased T1 decreased T2  signal focus within the posterior right uterine fundus (coronal  image 9, axial image 1), likely a small fibroid.   IMPRESSION:  1. Mild-to-moderate anterior superior right femoral head and  acetabular cartilage thinning.  2. Mild fluid deep to the right greater than left gluteus minimus  tendon insertions, mild tenosynovitis and/or mild sub-gluteal  bursitis.  3. 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    I personally reviewed and visualized the aforementioned imaging studies. I additionally personally interpreted any radiographs taken during today's visit.   Assessment & Plan: Diagnoses and all orders for this visit:  Right hip pain -     X-ray hip right 2 or 3 views with or without pelvis; Future    Lauren Higgins is a 53 y.o. female patient with R buttock and lateral thigh pain that appears to be more of a spinal etiology.  Given her current exam, I do not think that the hip joint is the source of her current symptoms. 1.  We discussed the clinical as well as imaging findings.  Imaging findings of early hip Arthritis as well as gluteal tendon abnormalities on MRI do not appear to be significantly symptomatic at this point in time.  I recommended that the patient reach back out to Dr. Avanell and/or the neurosurgery team for further workup/treatment options.  Patient was in agreement with this plan.  2.  Patient may follow-up with me as needed.  Over 45 minutes were spent on this patient visit including reviewing prior records, imaging studies including MRI and/or radiographs and associated reports, coordination of future care, documentation of today's visit, and discussion  with and counseling the patient as well as referring medical provider in  regards to the clinical findings, diagnosis, and treatment options.

## 2023-07-20 NOTE — Patient Instructions (Signed)
 Preventive Care 16-52 Years Old, Female  Preventive care refers to lifestyle choices and visits with your health care provider that can promote health and wellness. Preventive care visits are also called wellness exams.  What can I expect for my preventive care visit?  Counseling  Your health care provider may ask you questions about your:  Medical history, including:  Past medical problems.  Family medical history.  Pregnancy history.  Current health, including:  Menstrual cycle.  Method of birth control.  Emotional well-being.  Home life and relationship well-being.  Sexual activity and sexual health.  Lifestyle, including:  Alcohol, nicotine or tobacco, and drug use.  Access to firearms.  Diet, exercise, and sleep habits.  Work and work Astronomer.  Sunscreen use.  Safety issues such as seatbelt and bike helmet use.  Physical exam  Your health care provider will check your:  Height and weight. These may be used to calculate your BMI (body mass index). BMI is a measurement that tells if you are at a healthy weight.  Waist circumference. This measures the distance around your waistline. This measurement also tells if you are at a healthy weight and may help predict your risk of certain diseases, such as type 2 diabetes and high blood pressure.  Heart rate and blood pressure.  Body temperature.  Skin for abnormal spots.  What immunizations do I need?    Vaccines are usually given at various ages, according to a schedule. Your health care provider will recommend vaccines for you based on your age, medical history, and lifestyle or other factors, such as travel or where you work.  What tests do I need?  Screening  Your health care provider may recommend screening tests for certain conditions. This may include:  Lipid and cholesterol levels.  Diabetes screening. This is done by checking your blood sugar (glucose) after you have not eaten for a while (fasting).  Pelvic exam and Pap test.  Hepatitis B test.  Hepatitis C  test.  HIV (human immunodeficiency virus) test.  STI (sexually transmitted infection) testing, if you are at risk.  Lung cancer screening.  Colorectal cancer screening.  Mammogram. Talk with your health care provider about when you should start having regular mammograms. This may depend on whether you have a family history of breast cancer.  BRCA-related cancer screening. This may be done if you have a family history of breast, ovarian, tubal, or peritoneal cancers.  Bone density scan. This is done to screen for osteoporosis.  Talk with your health care provider about your test results, treatment options, and if necessary, the need for more tests.  Follow these instructions at home:  Eating and drinking    Eat a diet that includes fresh fruits and vegetables, whole grains, lean protein, and low-fat dairy products.  Take vitamin and mineral supplements as recommended by your health care provider.  Do not drink alcohol if:  Your health care provider tells you not to drink.  You are pregnant, may be pregnant, or are planning to become pregnant.  If you drink alcohol:  Limit how much you have to 0-1 drink a day.  Know how much alcohol is in your drink. In the [place] one drink equals one 12 oz bottle of beer (355 mL), one 5 oz glass of wine (148 mL), or one 1 oz glass of hard liquor (44 mL).  Lifestyle  Brush your teeth every morning and night with fluoride toothpaste. Floss one time each day.  Exercise for at least  30 minutes 5 or more days each week.  Do not use any products that contain nicotine or tobacco. These products include cigarettes, chewing tobacco, and vaping devices, such as e-cigarettes. If you need help quitting, ask your health care provider.  Do not use drugs.  If you are sexually active, practice safe sex. Use a condom or other form of protection to prevent STIs.  If you do not wish to become pregnant, use a form of birth control. If you plan to become pregnant, see your health care provider for a  prepregnancy visit.  Take aspirin only as told by your health care provider. Make sure that you understand how much to take and what form to take. Work with your health care provider to find out whether it is safe and beneficial for you to take aspirin daily.  Find healthy ways to manage stress, such as:  Meditation, yoga, or listening to music.  Journaling.  Talking to a trusted person.  Spending time with friends and family.  Minimize exposure to UV radiation to reduce your risk of skin cancer.  Safety  Always wear your seat belt while driving or riding in a vehicle.  Do not drive:  If you have been drinking alcohol. Do not ride with someone who has been drinking.  When you are tired or distracted.  While texting.  If you have been using any mind-altering substances or drugs.  Wear a helmet and other protective equipment during sports activities.  If you have firearms in your house, make sure you follow all gun safety procedures.  Seek help if you have been physically or sexually abused.  What's next?  Visit your health care provider once a year for an annual wellness visit.  Ask your health care provider how often you should have your eyes and teeth checked.  Stay up to date on all vaccines.  This information is not intended to replace advice given to you by your health care provider. Make sure you discuss any questions you have with your health care provider.  Document Revised: 06/20/2020 Document Reviewed: 06/20/2020  Elsevier Patient Education  2024 [employer].

## 2023-07-20 NOTE — Progress Notes (Unsigned)
 ANNUAL PREVENTATIVE CARE GYNECOLOGY  ENCOUNTER NOTE  SUBJECTIVE:       Lauren Higgins is a 53 y.o. G57P1001 female here for a routine annual gynecologic exam. The patient {is/is not/has never been:13135} sexually active. The patient {is/is not:13135} taking hormone replacement therapy. {post-men bleed:13152::Patient denies post-menopausal vaginal bleeding.} Family history of breast, uterine, ovarian cancer: {yes/no:311178}. The patient wears seatbelts: {yes/no:311178}. The patient participates in regular exercise: {yes/no/not asked:9010}. Has the patient ever been transfused or tattooed?: {yes/no/not asked:9010}. The patient reports that there {is/is not:9024} domestic violence in her life. Has the patient completed the Gardasil vaccine? no.  Current complaints: 1.  ***    Gynecologic History No LMP recorded. Patient has had an ablation. Contraception: tubal ligation Last Pap: 06/24/19. Results were: normal History of abnormal pap: *** History of STIs: *** Last Mammogram: 08/11/22. Results were: normal Last Colonoscopy: 10/03/22 Last Dexa Scan: n/a  PHQ-2:     02/04/2013    8:34 AM  Depression screen PHQ 2/9  Decreased Interest 0  Down, Depressed, Hopeless 0  PHQ - 2 Score 0    Obstetric History OB History  Gravida Para Term Preterm AB Living  1 1 1   1   SAB IAB Ectopic Multiple Live Births      1    # Outcome Date GA Lbr Len/2nd Weight Sex Type Anes PTL Lv  1 Term 12/21/97 [redacted]w[redacted]d  7 lb 10 oz (3.459 kg) M Vag-Spont   LIV    Past Medical History:  Diagnosis Date   Arthritis    Back, knees, thumbs   Chronic lower back pain    right side   Contraception management 02/01/2012   Depression    Graves disease    Hypertension    Hypothyroidism    following iodine ablation   Melanoma (HCC)    Menorrhagia    Motion sickness    cars   Obesity    Open-angle glaucoma    PONV (postoperative nausea and vomiting)    Pre-diabetes    Sciatica of right side    Thyroid  eye  disease     Family History  Problem Relation Age of Onset   Thyroid  disease Father    Hypertension Father    Skin cancer Father    Dementia Maternal Grandmother        dementia   Breast cancer Maternal Grandmother 95   Stroke Paternal Grandfather    Glaucoma Mother    Hypertension Mother    Breast cancer Paternal Aunt 76   Cancer Cousin 12    Past Surgical History:  Procedure Laterality Date   CARPAL TUNNEL RELEASE Right 11/01/2020   Procedure: RIGHT CARPAL TUNNEL RELEASE;  Surgeon: Marchia Drivers, MD;  Location: [facility];  Service: Orthopedics;  Laterality: Right;   COLONOSCOPY WITH PROPOFOL  N/A 10/03/2022   Procedure: COLONOSCOPY WITH BIOPSY;  Surgeon: Jinny Carmine, MD;  Location: [facility];  Service: Endoscopy;  Laterality: N/A;   ENDOMETRIAL ABLATION  05/07/2017   Dr Arloa   ENDOMETRIAL BIOPSY  2012   proliferative with focal breakdown   EYE SURGERY     multiple eye decompression surgeries from 2015 till present   POLYPECTOMY  10/03/2022   Procedure: POLYPECTOMY;  Surgeon: Jinny Carmine, MD;  Location: [facility];  Service: Endoscopy;;   radioactive iodine ablation     Graves disease   STRABISMUS SURGERY  03/04/2018    Social History   Socioeconomic History   Marital status: Married    Spouse  name: Not on file   Number of children: 1   Years of education: Not on file   Highest education level: Not on file  Occupational History   Occupation: Child psychotherapist  Tobacco Use   Smoking status: Never   Smokeless tobacco: Never  Vaping Use   Vaping status: Never Used  Substance and Sexual Activity   Alcohol use: No   Drug use: No   Sexual activity: Yes    Partners: Male    Birth control/protection: Condom, Surgical    Comment: Vasectomy  Other Topics Concern   Not on file  Social History Narrative   Not on file   Social Drivers of Health   Financial Resource Strain: Low Risk  (07/15/2023)   Received from [facility]    Overall Financial Resource Strain (CARDIA)    Difficulty of Paying Living Expenses: Not hard at all  Food Insecurity: No Food Insecurity (07/15/2023)   Received from [facility]   Hunger Vital Sign    Within the past 12 months, you worried that your food would run out before you got the money to buy more.: Never true    Within the past 12 months, the food you bought just didn't last and you didn't have money to get more.: Never true  Transportation Needs: No Transportation Needs (07/15/2023)   Received from [facility] - Transportation    In the past 12 months, has lack of transportation kept you from medical appointments or from getting medications?: No    Lack of Transportation (Non-Medical): No  Physical Activity: Sufficiently Active (04/02/2017)   Exercise Vital Sign    Days of Exercise per Week: 4 days    Minutes of Exercise per Session: 60 min  Stress: No Stress Concern Present (04/02/2017)   [employer] - Occupational Stress Questionnaire    Feeling of Stress : Not at all  Social Connections: Not on file  Intimate Partner Violence: Not on file    Current Outpatient Medications on File Prior to Visit  Medication Sig Dispense Refill   acetaminophen  (TYLENOL ) 500 MG tablet Take 1,000 mg by mouth every 6 (six) hours as needed for moderate pain or mild pain.     Berberine Chloride 500 MG CAPS Take by mouth.     Cholecalciferol (VITAMIN D3) 125 MCG (5000 UT) CAPS Take 5,000 Units by mouth daily.     Cyanocobalamin  (VITAMIN B-12) 5000 MCG SUBL Place 5,000 mcg under the tongue 3 (three) times a week.     MAGNESIUM  OXIDE 400 PO Take 400 mg by mouth daily.     meloxicam  (MOBIC ) 15 MG tablet TAKE 1 TABLET BY MOUTH ONCE DAILY . TAKEWITH FOOD 30 tablet 2   Misc Natural Products (ELDERBERRY IMMUNE COMPLEX) CHEW Chew 2 each by mouth daily.     NP THYROID  30 MG tablet Take 60 mg by mouth in the morning and at bedtime.      phentermine (ADIPEX-P) 37.5 MG tablet Take 37.5 mg by mouth daily before breakfast.     Probiotic Product (PROBIOTIC DAILY PO) Take by mouth.     No current facility-administered medications on file prior to visit.    Allergies  Allergen Reactions   Tape Rash    Whelts     Review of Systems ROS Review of Systems - General ROS: negative for - chills, fatigue, fever, hot flashes, night sweats, weight gain or weight loss Psychological ROS: negative for -  anxiety, decreased libido, depression, mood swings, physical abuse or sexual abuse Ophthalmic ROS: negative for - blurry vision, eye pain or loss of vision ENT ROS: negative for - headaches, hearing change, visual changes or vocal changes Allergy and Immunology ROS: negative for - hives, itchy/watery eyes or seasonal allergies Hematological and Lymphatic ROS: negative for - bleeding problems, bruising, swollen lymph nodes or weight loss Endocrine ROS: negative for - galactorrhea, hair pattern changes, hot flashes, malaise/lethargy, mood swings, palpitations, polydipsia/polyuria, skin changes, temperature intolerance or unexpected weight changes Breast ROS: negative for - new or changing breast lumps or nipple discharge Respiratory ROS: negative for - cough or shortness of breath Cardiovascular ROS: negative for - chest pain, irregular heartbeat, palpitations or shortness of breath Gastrointestinal ROS: no abdominal pain, change in bowel habits, or black or bloody stools Genito-Urinary ROS: no dysuria, trouble voiding, or hematuria Musculoskeletal ROS: negative for - joint pain or joint stiffness Neurological ROS: negative for - bowel and bladder control changes Dermatological ROS: negative for rash and skin lesion changes   OBJECTIVE:   There were no vitals taken for this visit.  CONSTITUTIONAL: Well-developed, well-nourished female in no acute distress.  PSYCHIATRIC: Normal mood and affect. Normal behavior. Normal judgment and  thought content. NEUROLGIC: Alert and oriented to person, place, and time. Normal muscle tone coordination. No cranial nerve deficit noted. HENT:  Normocephalic, atraumatic, External right and left ear normal. Oropharynx is clear and moist EYES: Conjunctivae and EOM are normal. No scleral icterus.  NECK: Normal range of motion, supple, no masses.  Normal thyroid .  SKIN: Skin is warm and dry. No rash noted. Not diaphoretic. No erythema. No pallor. CARDIOVASCULAR: Normal heart rate noted, regular rhythm, no murmur. RESPIRATORY: Clear to auscultation bilaterally. Effort and breath sounds normal, no problems with respiration noted. BREASTS: Symmetric in size. No masses, skin changes, nipple drainage, or lymphadenopathy. ABDOMEN: Soft, normal bowel sounds, no distention noted.  No tenderness, rebound or guarding.  PELVIC:  Bladder {:311640}  Urethra: {:311719}  Vulva: {:311722}  Vagina: {:311643}  Cervix: {:311644}  Uterus: {:311718}  Adnexa: {:311645}  RV: {Blank multiple:19196::External Exam NormaI,No Rectal Masses,Normal Sphincter tone}  MUSCULOSKELETAL: Normal range of motion. No tenderness.  No cyanosis, clubbing, or edema.  2+ distal pulses. LYMPHATIC: No Axillary, Supraclavicular, or Inguinal Adenopathy.  Labs: Lab Results  Component Value Date   WBC 10.2 02/23/2015   HGB 14.7 02/23/2015   HCT 43.7 02/23/2015   MCV 84.6 02/23/2015   PLT 227 02/23/2015    Lab Results  Component Value Date   CREATININE 0.97 02/23/2015   BUN 15 02/23/2015   NA 141 02/23/2015   K 3.7 02/23/2015   CL 105 02/23/2015   CO2 26 02/23/2015    No results found for: ALT, AST, GGT, ALKPHOS, BILITOT  No results found for: CHOL, HDL, LDLCALC, LDLDIRECT, TRIG, CHOLHDL  No results found for: TSH  No results found for: HGBA1C   ASSESSMENT:   No diagnosis found.   PLAN:   KADINCE BOXLEY is a 53 y.o. G39P1001 female here today for her annual exam, doing well.   Pap: done with cotesting today Mammogram: ordered***due *** Colon: PCP *** ordered colonoscopy***Cologuard -OR- due *** Labs: ***A1C, CMP, HepC, Lipid panel, Vit D, TSH PHQ-2 = ***, discussed coping techniques; RTC if worsens or develops concern Contraception: *** Healthy lifestyle modifications discussed: multivitamin, diet, exercise, sunscreen, tobacco and alcohol use. Emphasized importance of regular physical activity.  Calcium and Vit D recommendation reviewed.  All questions answered to  patient's satisfaction.   Follow up 1 yr for annual, sooner prn.    Estil Mangle, DO [facility]

## 2023-07-22 ENCOUNTER — Encounter: Payer: Self-pay | Admitting: Obstetrics

## 2023-07-22 ENCOUNTER — Ambulatory Visit: Admitting: Obstetrics

## 2023-07-22 VITALS — BP 133/79 | HR 72 | Ht 70.0 in | Wt 212.0 lb

## 2023-07-22 DIAGNOSIS — Z1159 Encounter for screening for other viral diseases: Secondary | ICD-10-CM

## 2023-07-22 DIAGNOSIS — Z01419 Encounter for gynecological examination (general) (routine) without abnormal findings: Secondary | ICD-10-CM

## 2023-07-22 DIAGNOSIS — E669 Obesity, unspecified: Secondary | ICD-10-CM | POA: Diagnosis not present

## 2023-07-22 DIAGNOSIS — N951 Menopausal and female climacteric states: Secondary | ICD-10-CM | POA: Diagnosis not present

## 2023-07-22 DIAGNOSIS — Z1231 Encounter for screening mammogram for malignant neoplasm of breast: Secondary | ICD-10-CM

## 2023-08-14 ENCOUNTER — Ambulatory Visit
Admission: RE | Admit: 2023-08-14 | Discharge: 2023-08-14 | Disposition: A | Discharge status: Home / Self Care | Source: Ambulatory Visit | Attending: Obstetrics | Admitting: Obstetrics

## 2023-08-14 DIAGNOSIS — Z1231 Encounter for screening mammogram for malignant neoplasm of breast: Secondary | ICD-10-CM | POA: Insufficient documentation

## 2023-08-14 DIAGNOSIS — Z01419 Encounter for gynecological examination (general) (routine) without abnormal findings: Secondary | ICD-10-CM

## 2023-08-18 DIAGNOSIS — M47816 Spondylosis without myelopathy or radiculopathy, lumbar region: Secondary | ICD-10-CM | POA: Diagnosis not present

## 2023-08-18 DIAGNOSIS — Z6831 Body mass index (BMI) 31.0-31.9, adult: Secondary | ICD-10-CM | POA: Diagnosis not present

## 2023-09-10 DIAGNOSIS — M47816 Spondylosis without myelopathy or radiculopathy, lumbar region: Secondary | ICD-10-CM | POA: Diagnosis not present

## 2023-09-10 DIAGNOSIS — M48062 Spinal stenosis, lumbar region with neurogenic claudication: Secondary | ICD-10-CM | POA: Diagnosis not present

## 2023-09-10 DIAGNOSIS — M5126 Other intervertebral disc displacement, lumbar region: Secondary | ICD-10-CM | POA: Diagnosis not present

## 2023-09-10 DIAGNOSIS — M5416 Radiculopathy, lumbar region: Secondary | ICD-10-CM | POA: Diagnosis not present

## 2023-09-21 DIAGNOSIS — M25511 Pain in right shoulder: Secondary | ICD-10-CM | POA: Diagnosis not present

## 2023-09-21 DIAGNOSIS — S46011A Strain of muscle(s) and tendon(s) of the rotator cuff of right shoulder, initial encounter: Secondary | ICD-10-CM | POA: Diagnosis not present

## 2023-09-21 DIAGNOSIS — M222X2 Patellofemoral disorders, left knee: Secondary | ICD-10-CM | POA: Diagnosis not present

## 2023-09-21 DIAGNOSIS — G8929 Other chronic pain: Secondary | ICD-10-CM | POA: Diagnosis not present

## 2023-10-02 DIAGNOSIS — E063 Autoimmune thyroiditis: Secondary | ICD-10-CM | POA: Diagnosis not present

## 2023-11-12 DIAGNOSIS — M5416 Radiculopathy, lumbar region: Secondary | ICD-10-CM | POA: Diagnosis not present

## 2023-11-12 DIAGNOSIS — M48062 Spinal stenosis, lumbar region with neurogenic claudication: Secondary | ICD-10-CM | POA: Diagnosis not present

## 2023-11-12 DIAGNOSIS — M5126 Other intervertebral disc displacement, lumbar region: Secondary | ICD-10-CM | POA: Diagnosis not present

## 2023-12-11 DIAGNOSIS — M48062 Spinal stenosis, lumbar region with neurogenic claudication: Secondary | ICD-10-CM | POA: Diagnosis not present

## 2023-12-11 DIAGNOSIS — M5126 Other intervertebral disc displacement, lumbar region: Secondary | ICD-10-CM | POA: Diagnosis not present

## 2023-12-11 DIAGNOSIS — M5416 Radiculopathy, lumbar region: Secondary | ICD-10-CM | POA: Diagnosis not present

## 2024-01-01 DIAGNOSIS — J101 Influenza due to other identified influenza virus with other respiratory manifestations: Secondary | ICD-10-CM | POA: Diagnosis not present

## 2024-01-01 DIAGNOSIS — M791 Myalgia, unspecified site: Secondary | ICD-10-CM | POA: Diagnosis not present
